# Patient Record
Sex: Female | Born: 1940 | Race: White | Hispanic: No | Smoking: Former smoker
Health system: Southern US, Community
[De-identification: ages and names within clinical notes are randomized; demographics above are authoritative.]

## PROBLEM LIST (undated history)

## (undated) DIAGNOSIS — K279 Peptic ulcer, site unspecified, unspecified as acute or chronic, without hemorrhage or perforation: Secondary | ICD-10-CM

## (undated) DIAGNOSIS — C7951 Secondary malignant neoplasm of bone: Secondary | ICD-10-CM

## (undated) DIAGNOSIS — D3A00 Benign carcinoid tumor of unspecified site: Secondary | ICD-10-CM

## (undated) DIAGNOSIS — I10 Essential (primary) hypertension: Secondary | ICD-10-CM

## (undated) DIAGNOSIS — IMO0002 Reserved for concepts with insufficient information to code with codable children: Secondary | ICD-10-CM

## (undated) HISTORY — DX: Secondary malignant neoplasm of bone: C79.51

## (undated) HISTORY — PX: CHOLECYSTECTOMY: SHX55

## (undated) HISTORY — PX: HERNIA REPAIR: SHX51

## (undated) HISTORY — PX: ANKLE DEBRIDEMENT: SHX1147

## (undated) HISTORY — DX: Benign carcinoid tumor of unspecified site: D3A.00

## (undated) HISTORY — DX: Reserved for concepts with insufficient information to code with codable children: IMO0002

## (undated) HISTORY — DX: Peptic ulcer, site unspecified, unspecified as acute or chronic, without hemorrhage or perforation: K27.9

## (undated) HISTORY — PX: DILATION AND CURETTAGE OF UTERUS: SHX78

## (undated) HISTORY — DX: Essential (primary) hypertension: I10

---

## 2001-09-29 ENCOUNTER — Observation Stay (HOSPITAL_COMMUNITY): Admission: RE | Admit: 2001-09-29 | Discharge: 2001-09-30 | Payer: Self-pay | Admitting: General Surgery

## 2003-10-18 ENCOUNTER — Emergency Department (HOSPITAL_COMMUNITY): Admission: EM | Admit: 2003-10-18 | Discharge: 2003-10-18 | Payer: Self-pay | Admitting: Emergency Medicine

## 2003-10-26 ENCOUNTER — Emergency Department (HOSPITAL_COMMUNITY): Admission: RE | Admit: 2003-10-26 | Discharge: 2003-10-26 | Payer: Self-pay | Admitting: Emergency Medicine

## 2003-10-29 ENCOUNTER — Inpatient Hospital Stay (HOSPITAL_COMMUNITY): Admission: AD | Admit: 2003-10-29 | Discharge: 2003-11-15 | Payer: Self-pay | Admitting: Family Medicine

## 2003-11-01 HISTORY — PX: ESOPHAGOGASTRODUODENOSCOPY: SHX1529

## 2003-11-04 ENCOUNTER — Encounter (INDEPENDENT_AMBULATORY_CARE_PROVIDER_SITE_OTHER): Payer: Self-pay | Admitting: *Deleted

## 2003-11-04 ENCOUNTER — Encounter: Payer: Self-pay | Admitting: Internal Medicine

## 2003-11-09 HISTORY — PX: OTHER SURGICAL HISTORY: SHX169

## 2003-11-09 HISTORY — PX: COLONOSCOPY: SHX174

## 2003-11-19 ENCOUNTER — Other Ambulatory Visit: Admission: RE | Admit: 2003-11-19 | Discharge: 2003-11-19 | Payer: Self-pay | Admitting: Obstetrics & Gynecology

## 2004-10-14 ENCOUNTER — Ambulatory Visit (HOSPITAL_COMMUNITY): Admission: RE | Admit: 2004-10-14 | Discharge: 2004-10-14 | Payer: Self-pay | Admitting: Obstetrics & Gynecology

## 2007-01-30 ENCOUNTER — Ambulatory Visit (HOSPITAL_COMMUNITY): Admission: RE | Admit: 2007-01-30 | Discharge: 2007-01-30 | Payer: Self-pay | Admitting: Family Medicine

## 2007-02-06 ENCOUNTER — Ambulatory Visit (HOSPITAL_COMMUNITY): Admission: RE | Admit: 2007-02-06 | Discharge: 2007-02-06 | Payer: Self-pay | Admitting: Family Medicine

## 2008-05-15 ENCOUNTER — Ambulatory Visit (HOSPITAL_COMMUNITY): Admission: RE | Admit: 2008-05-15 | Discharge: 2008-05-15 | Payer: Self-pay | Admitting: Family Medicine

## 2008-11-05 ENCOUNTER — Encounter: Payer: Self-pay | Admitting: Internal Medicine

## 2009-12-09 ENCOUNTER — Encounter: Payer: Self-pay | Admitting: Internal Medicine

## 2010-04-01 ENCOUNTER — Ambulatory Visit (HOSPITAL_COMMUNITY): Admission: RE | Admit: 2010-04-01 | Discharge: 2010-04-01 | Payer: Self-pay | Admitting: Family Medicine

## 2010-04-08 ENCOUNTER — Ambulatory Visit: Payer: Self-pay | Admitting: Cardiology

## 2010-04-09 ENCOUNTER — Ambulatory Visit (HOSPITAL_COMMUNITY): Admission: RE | Admit: 2010-04-09 | Discharge: 2010-04-09 | Payer: Self-pay | Admitting: Family Medicine

## 2010-04-09 ENCOUNTER — Encounter: Payer: Self-pay | Admitting: Family Medicine

## 2010-07-28 NOTE — Letter (Signed)
Summary: External Other  External Other   Imported By: Peggyann Shoals 12/09/2009 10:43:31  _____________________________________________________________________  External Attachment:    Type:   Image     Comment:   External Document

## 2010-11-13 NOTE — Op Note (Signed)
NAME:  Sarah Casey, Sarah Casey                      ACCOUNT NO.:  [id number]   MEDICAL RECORD NO.:  [id number]                   PATIENT TYPE:  INP   LOCATION:  A316                                 FACILITY:  [facility]   PHYSICIAN:  R. Roetta Sessions, M.D.              DATE OF BIRTH:  01/12/1941   DATE OF PROCEDURE:  10/30/2003  DATE OF DISCHARGE:                                 OPERATIVE REPORT   PROCEDURE:  Esophagogastroduodenoscopy.   INDICATIONS:  The patient is a 70 year old lady admitted to the hospital  with abdominal pain, nausea and vomiting.  An EGD is now being done to  further evaluate her symptoms.  This approach has been discussed with the  patient at length.  The potential risks, benefits and alternatives have been  reviewed.  Please see my documentation in the medical record.   DESCRIPTION OF PROCEDURE:  Oxygen saturation, blood pressure, pulse and  respiration were monitored throughout the entire procedure.  Conscious  sedation with IV Versed and Demerol in incremental doses.  The instrument  was the Olympus video chip gastroscope.   FINDINGS:  Esophagus:  Examination of the tubular esophagus reveals  __________  Schatzki's ring; otherwise the esophageal mucosal appeared  normal.  The EG junction was tight and easily traversed.   Stomach:  The gastric cavity was empty and insufflated well with air.  A  thorough examination of the gastric mucosa including the retroflexed view of  the proximal stomach and esophagogastric junction demonstrated normal  mucosa, patent pylorus.   Duodenum:  Examination of the bulb revealed diffuse bulbar edema and some  superficial erosions.  No ulcer crater was identified.  Small bowel was  patent through the second portion of the duodenum.  Otherwise, mucosa of D1 and D2 appeared normal.   THERAPY AND DIAGNOSTIC MANEUVERS:  None.   The patient tolerated the procedure well and was reactive to endoscopy.   IMPRESSION:  1. __________  Schatzki's ring; otherwise normal esophagus.  Schatzki's ring     not manipulated.  2. Normal gastric mucosa, patent pylorus bulbar edema, and erosions of     uncertain significance.  No gastric outlet obstruction.  Otherwise D1 and     D2 appear normal.   There is no explanation for the patient's acute GI symptoms based on today's  exam and findings.  It is amylase and lipase came back normal at 63 and 41.   RECOMMENDATIONS:  1. Proceed with abdominal pelvic CT scan to further evaluate her abdominal     pain and the fullness appreciated on physical examination above her     umbilicus.  2. Would reattempt H pylori treatment once over her acute illness.  3. Because she is heme-positive, would offer this lady an outpatient     colonoscopy to complete her evaluation.  4. Further recommendations to follow.      ___________________________________________  Jonathon Bellows, M.D.   RMR/MEDQ  D:  10/30/2003  T:  10/31/2003  Job:  [id number]   cc:   Donna Bernard, M.D.  [address]  [place]  [place] [address]  Fax: [phone]

## 2010-11-13 NOTE — Op Note (Signed)
NAME:  Sarah Casey, Sarah Casey                      ACCOUNT NO.:  [id number]   MEDICAL RECORD NO.:  [id number]                   PATIENT TYPE:  INP   LOCATION:  A316                                 FACILITY:  [facility]   PHYSICIAN:  Lionel December, M.D.                 DATE OF BIRTH:  04/13/1941   DATE OF PROCEDURE:  11/09/2003  DATE OF DISCHARGE:                                 OPERATIVE REPORT   PROCEDURE:  Total colonoscopy with polypectomy.   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  Her EGD did not  reveal any primary lesion.  She is undergoing colonoscopy followed by a  small bowel study if necessary.  Family history is positive for colon  carcinoma.  Procedure risks were reviewed with the patient.  Informed  consent was obtained.   PREOPERATIVE MEDICATIONS:  1. Demerol 50 mg IV.  2. Versed 7 mg IV in divided dose.   FINDINGS:  Procedure performed in endoscopy suite.  The patient's vital  signs and O2 saturations were monitored during the procedure and remained  stable.  The patient was placed in left lateral decubitus position and  rectal examination performed.  No abnormality noted on external or digital  exam.  Olympus video scope was placed in the rectum and advanced under  vision in sigmoid colon and beyond.  Preparation was satisfactory.  She had  a few scattered diverticula at sigmoid colon.  Scope was passed to cecum  which was identified by lipomatous ileocecal valve.  Appendiceal orifice was  well-seen.  Pictures taken for the record.  There was a 7 mm polyp across  the ileocecal valve which was snared and freed for histologic examination.  There were 3 polyps in the area of hepatic flexure that were ablated by cold  biopsy.  Two that were located distally were placed in 1 container and  labeled proximal transverse colon, another one labeled hepatic flexure.  There was another small  polyp of rectum that was ablated by a cold biopsy.  The scope was retroflexed to examine the anorectal junction which was  unremarkable.  The scope was straightened and withdrawn.  The patient  tolerated the procedure well.   FINAL DIAGNOSES:  1. Five small polyps.  One from cecum was snared.  The others were biopsied,     three in the area of hepatic flexure     and one at rectum.  None of these polyps appeared to be malignant in     appearance.  2. Sigmoid colon diverticulosis.   RECOMMENDATIONS:  We will proceed with small bowel capsule study today.      ___________________________________________  Lionel December, M.D.   NR/MEDQ  D:  11/09/2003  T:  11/10/2003  Job:  [id number]   cc:   Donna Bernard, M.D.  [address]  [place]  [place] [address]  Fax: [phone]   Ladona Horns. Neijstrom, MD  [address]  [place]  [place] [address]  Fax: [phone]

## 2010-11-13 NOTE — Consult Note (Signed)
NAME:  Sarah Casey, Sarah Casey                      ACCOUNT NO.:  [id number]   MEDICAL RECORD NO.:  [id number]                   PATIENT TYPE:  INP   LOCATION:  A316                                 FACILITY:  [facility]   PHYSICIAN:  R. Roetta Sessions, M.D.              DATE OF BIRTH:  10-17-1940   DATE OF CONSULTATION:  DATE OF DISCHARGE:                                   CONSULTATION   CHIEF COMPLAINT:  The patient is a 70 year old Caucasian female who has been  experiencing mid abdominal pain since October 18, 2003.  The patient has been  to the emergency room two times in last two weeks with pain which she  describes as a 10/10.  The pain was localized to the mid abdominal area  which some radiation to the left flank.  Emergency room treatment consistent  of pain medications.  The patient was seen by her primary care physician,  Dr. Gerda Diss, who tested the patient for H pylori and treated her when results  were positive with a Prevpac.  The patient states she was unable to tolerate  the Prevpac and reacted by vomiting and having extreme nausea.  The patient  was seen by Dr. Gerda Diss on Oct 29, 2003, and was guaiac positive by hemoccult.  The patient was admitted by Dr. Gerda Diss.  Prior to this episode, the patient  denies fever.  She did have occasional shaking chills. Denies weight changes  or loss of appetite.  The patient denies any heart problems such as chest  pain, palpitations or fluttering.  The patient denies respiratory problems  such as shortness of breath or coughing.  The patient states that prior to  this two-week period, she had some belching, but no frank gastroesophageal  reflux disease or reflux.  The patient had an EGD done years ago and was  diagnosed as having peptic ulcer disease.  The patient denies hematemesis.  The patient reports swallowing difficulty with food, but not more than one  to two times per month.  The patient denies diarrhea or constipation,  reporting one formed  stool a day as being normal for her.  The patient  denies hematochezia or melena.  The patient has never had a colonoscopy.   CURRENT MEDICATIONS:  At home, the patient states she takes an occasional  Tylenol.  She denies the use of aspirin, nonsteroidals, Goody powders,  Stanback or BC's.  The patient had been on a Prevpac and Oxycodone for her  pain.   Since being admitted to the hospital, the patient is on:  1. Protonix 40 mg q.24heart rate.  2. Xanax.  3. Reglan.  4. Morphine sulfate for pain as needed.   PAST MEDICAL HISTORY:  The patient reports no chronic illnesses.   SURGICAL HISTORY:  1. Umbilical hernia repair by Dr. Lovell Sheehan approximately two years ago.  2. Cholecystectomy by Dr. Lovell Sheehan in 1991.   ALLERGIES:  Levaquin.   FAMILY  HISTORY:  Mother died at the age of 11 of natural causes.  Father  died at the age of 53 of natural causes.  The patient has two brothers in  good health.  The patient has five sisters, two of whom are dead, one of  natural causes, and one who died of colon cancer at the age of 54.   SOCIAL HISTORY:  The patient is widowed and lives alone.  The patient has 10  children in good health.  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  The patient smokes one pack of  cigarettes per day for the last 35 years.  The patient drinks alcohol  occasionally, not more than one time per year.  The patient denies the use  of street drugs.   PHYSICAL EXAMINATION:  VITAL SIGNS:  Temperature 97.5, pulse 57,  respirations 20, blood pressure 103/47.  HEENT:  The head is normocephalic, atraumatic.  Eyes:  Clear sclerae without  icterus.  Conjunctivae are pink. Oropharynx is moist and pink without  lesions.  The patient has upper and lower dentures.  No masses were  palpated.  NECK:  Supple.  SKIN:  Dry, clear, and without jaundice.  CARDIOVASCULAR:  Heart is regular rate and rhythm, no murmurs, rubs or  gallops.   RESPIRATORY:  The patient has a few faint crackles at the right lung base.  No wheezes or rhonchi.  ABDOMEN:  On exam, the abdomen is soft.  There is a periumbilical scar from  hernia repair.  There is no organomegaly.  There is a fullness palpated,  superior umbilicus which is tender to palpation.  Bowel sounds are active  and present in all four quadrants.  EXTREMITIES:  No clubbing, no edema or cyanosis.   LABORATORY DATA:  White count 5.6, hemoglobin 13.0, hematocrit 37.6,  platelets 356,000.  Sodium 136, potassium 4.6, chloride 106, cO2 29, glucose  is 97.  BUN 20, creatinine 0.9.  Total bilirubin 0.5, alkaline phosphatase  85, SGOT 22, SGPT 20.  Total protein 6.2, albumin 3.9, calcium 9.3.   DIAGNOSTIC IMAGING AND ABDOMINAL X-RAY:  On admission, showed normal gas  pattern.   IMPRESSION:  1. With the patient's history of peptic ulcer disease, there could be a     recurrence especially in light of the H pylori serology being positive.     Pancreatitis is a possibility, and laboratory studies including amylase     and lipase will be done.  2. It is possible that the patient has an incarcerated hernia with her     history and with the fullness palpated superior umbilicus.   RECOMMENDATIONS:  1. Amylase and lipase, to investigate pancreatitis and EGD today based on     the patient's H pylori positive serology and history of peptic ulcer     disease.  2. Based on these results, a CT scan of the abdomen would be done after the     EGD, and recommendations for colonoscopy secondary to the patient's     family history and the fact that she has never had a colonoscopy.   Thank you for this consultation.     ________________________________________  ___________________________________________  Ashok Pall, PA                           Jonathon Bellows, M.D.   GC/MEDQ  D:  10/30/2003  T:  10/30/2003  Job:  [id number]

## 2010-11-13 NOTE — Consult Note (Signed)
NAME:  Sarah Casey, Sarah Casey                      ACCOUNT NO.:  [id number]   MEDICAL RECORD NO.:  [id number]                   PATIENT TYPE:  INP   LOCATION:  A316                                 FACILITY:  [facility]   PHYSICIAN:  Ladona Horns. Neijstrom, MD               DATE OF BIRTH:  1941/06/24   DATE OF CONSULTATION:  11/06/2003  DATE OF DISCHARGE:                                   CONSULTATION   DIAGNOSES:  1. 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  2. Long standing smoking history.  3. Anemia.  4. Uterine abnormality which needs to be evaluated with possible biopsy, but     clearly she needs a GYN evaluation.   HISTORY:  This is a very pleasant, 70 year old Caucasian lady who was  admitted on 10/29/2003 with a 2-week history of ongoing abdominal pain that  is primarily epigastrically located, usually worse after meals, associated  with nausea and vomiting, but no distinct real weight loss.  She was given  medications for H. pylori positivity which she could not really keep down.  The nausea and vomiting continued. She sought help with Dr. Milinda Cave in Dr.  Fletcher Anon absence and then even went to the emergency room and when things  were not getting any better, Dr. Gerda Diss felt that he should admit her for  definitive diagnosis and therapy.   She was found on scans of her liver to have a large lesion, at least 6 cm in  size, very atypical and suggestive of metastatic disease in that area.  A  biopsy was done, this past Monday, which shows a neuroendocrine carcinoma,  but special stains are pending by Dr. Berneta Levins at [facility].   Upon this admission her white count was 5200.  Hemoglobin 11.4 gm.  MCV was  normal, platelets of 297,000.  Differential was unremarkable.  Her PT was  normal. Her bleeding time was 6 minutes.  Her electrolytes were normal.  Her  BUN and creatinine  were normal.  Her liver enzymes showed a normal alkaline  phosphatase, normal SGOT, normal SGPT, normal total protein, slightly low  albumin at 3.4, normal calcium. Amylase and lipase were normal.   She has been seen by me today and I have discussed things with her.   She is a widow.  She lives by herself here in the [place] area.  She has  been a smoker off and on since around age 60 when her husband died.  She has  smoked as much as a pack of cigarettes a day, possibly more.   She has 9 boys and 1 daughter and they are in good health to the best of her  knowledge.  She has several siblings, but some of them have died from either  chronic lung disease or other ailments.  She has seen some blood in her stools and she has been seen by Drs. Rourk or  Rehman in consultation, I should add.   PHYSICAL EXAMINATION:  GENERAL:  Her physical exam shows a pleasant lady.  VITAL SIGNS:  Show no fever, normal blood pressure at this time.  LYMPH NODE EXAM:  Negative throughout.  LUNGS:  Show mildly decreased breath sounds.  No rubs or rales.  BREASTS:  Exam is negative for masses.  HEART:  Her heart shows a regular rhythm and rate without distinct murmur,  rub, or gallop.  ABDOMEN:  She is tender, especially over the epigastric and left upper  quadrants, but I cannot feel an enlarged liver. There is no distinct rub  over the liver.  Bowel sounds are diminished but present.  She has no  inguinal nodes, axillary nodes, etcetera.  EXTREMITIES:  She has no peripheral edema.  Pulses are trace to 1+ in her  feet.  HEENT:  She essentially has no oral abnormalities. She has many teeth, of  course, that are gone.  She has pupils which are equally round and reacted  to light.  PELVIC:  She states that she has not had a Pap smear in at least 28 years.  She denied any vaginal bleeding, I should add.  She does not have any pain  in her pelvis.  RECTAL:  Her bowel function has been good, but she has  noticed some blood in  her stools.   I have reviewed this lady's x-rays and with this neuroendocrine tumor she  certainly needs a formal CT of the chest with her history of smoking.   We need to await the final results from Dr. Clelia Croft.  We will be in touch with  her if the results are not back, but we will get the CT of the chest ordered  today.      ___________________________________________                                            Ladona Horns. Mariel Sleet, MD   ESN/MEDQ  D:  11/06/2003  T:  11/06/2003  Job:  [id number]   cc:   Donna Bernard, M.D.  [address]  [place]  [place] [address]  Fax: [phone]

## 2010-11-13 NOTE — H&P (Signed)
NAME:  Sarah Casey, Sarah Casey                      ACCOUNT NO.:  [id number]   MEDICAL RECORD NO.:  [id number]                   PATIENT TYPE:  INP   LOCATION:  A316                                 FACILITY:  [facility]   PHYSICIAN:  Donna Bernard, M.D.             DATE OF BIRTH:  18-Sep-1940   DATE OF ADMISSION:  10/29/2003  DATE OF DISCHARGE:                                HISTORY & PHYSICAL   CHIEF COMPLAINT:  Abdominal pain.   HISTORY OF PRESENT ILLNESS:  This patient is a 70 year old white female with  a reported history of prior peptic ulcer disease, who presented to the  office on the day of admission with complaints of abdominal pain.  The  patient had been seen twice in our office over the past two weeks, once in  Dr. Maryjean Morn. McGowen's office, and twice in the emergency room with  abdominal complaints.  She noted mid-epigastric pain and tenderness.  She  had intermittent spells of vomiting.  Blood work revealed positive H pylori.  She was started on Prevpac in the last few days, but states unable to take  it due to nausea and vomiting.  When seen on the day of admission, the  patient notes epigastric discomfort intermittent, and virtual inability to  drink any type of fluids.  She is unable to take her medication.   PRIOR MEDICAL HISTORY:  Significant for diagnosis of stomach ulcers in the  1980's, also significant for recent umbilical hernia repair in 2003.   PRIOR SURGERIES:  1. Remote ankle surgery.  2. Remote cholecystectomy.   FAMILY HISTORY:  Noncontributory.   ALLERGIES:  The patient states LODINE.   SOCIAL HISTORY:  The patient is widowed and smokes one pack a day.  She has  10 children.   REVIEW OF SYSTEMS:  Otherwise negative.   PHYSICAL EXAMINATION:  VITAL SIGNS:  Blood pressure 140/86.  GENERAL:  The patient is alert and in some distress, holding her abdomen.  HEENT:  Normal.  Mucous membranes are slightly dry.  NECK:  Supple.  LUNGS:  Clear.  HEART:   Regular rate and rhythm.  ABDOMEN:  Mild-to-moderate epigastric tenderness to deep palpation.  No CVA  tenderness.  Good bowel sounds, no masses.  EXTREMITIES:  Normal.  RECTAL EXAM:  Heme positive stool noted.   IMPRESSION:  1. Mid abdominal pain and tenderness with vomiting, unable to keep down food     or liquids.  2. Recurrence of peptic ulcer disease with a gastric outlet syndrome     picture.  This is discussed with the patient.  There are other possible     etiologies to her distress.   PLAN:  1. Admit for IV fluids.  2. IV Protonix.  3. GI consultation.  4. Other orders as now in the chart.     ___________________________________________  Donna Bernard, M.D.   Karie Chimera  D:  10/31/2003  T:  10/31/2003  Job:  [id number]

## 2010-11-13 NOTE — Op Note (Signed)
NAME:  Sarah Casey, Sarah Casey                      ACCOUNT NO.:  [id number]   MEDICAL RECORD NO.:  [id number]                   PATIENT TYPE:  INP   LOCATION:  A316                                 FACILITY:  [facility]   PHYSICIAN:  R. Roetta Sessions, M.D.              DATE OF BIRTH:  March 02, 1941   DATE OF PROCEDURE:  11/01/2003  DATE OF DISCHARGE:                                 OPERATIVE REPORT   PROCEDURE:  Esophagogastroduodenoscopy with a side viewing duodenoscope.   ENDOSCOPIST:  Gerrit Friends. Rourk, M.D.   INDICATIONS FOR PROCEDURE:  The patient is a 71 year old lady admitted to  the hospital with upper abdominal pain, nausea and vomiting.  A CT scan  suggests a dilated bile duct and a right hepatic lobe lesion.  Also  thickening of the uterus demanding further evaluation as well.  She has  positive family history of colorectal carcinoma.  She is heme-positive and  has never had a colonoscopy.  An MRCP followed the abdominal CT.  The  biliary tree  did not appear to be as dilated; there was a nice taper  distally.  There was a question of an ampullary mass seen on CT scan.  The  MRI confirmed a right hepatic lobe lesion.  There was no obvious biliary  stricture or stone.  Her LFTs have been repeatedly normal.   She underwent an EGD a couple of days ago to evaluate her symptoms.  She had  some bulbar erosions and edema; but, otherwise, no significant findings. I  did no adequate see the ampullary area.  Repeat upper endoscopy with a side  viewing duodenoscope to image the ampulla is now being done just to make  sure that she does not have a distal tumor.  This approach has been  discussed with the patient and family members at length.  If there was a  tumor present the algorithm was to go ahead and cannulate the bile duct,  perform a cholangiogram and potentially put a prophylactic stent in.  All  parties are agreeable.  Please see the documentation in the medical record.   PROCEDURE NOTE:   The patient was placed on the fluoroscopic table in the  semiprone position.  Conscious sedation: IV Versed and Demerol in  incremental doses.  Cetacaine spray for topical oropharyngeal anesthesia.   INSTRUMENT:  Olympus therapeutic side viewing duodenoscope.   FINDINGS:  Cursory examination of distal esophagus and stomach revealed no  abnormalities.  The pylorus was patent.  Again, some bulbar edema and  erosions were identified without a frank ulcer being seen.  The second  portion of the duodenum was easily accessed with the scope.  It was pulled  back to the short position 55 cm from the incisors. The Ampulla of Vater was  readily identified on the medial wall of the second portion of the duodenum.  There was noted to be a somewhat prominent intramural segment; however, the  mucosa at the ampullary orifice and surrounding it appeared otherwise  normal.  Please see photos.  No attempts at ERCP were made.   The patient tolerated the brief procedure well.   IMPRESSION:  1. Duodenal bulbar erosion/edema as described above.  2. Distal esophagus, distal stomach, and pylorus appeared normal.  3. Normal Ampulla of Vater as described above.   DISCUSSION:  I have had additional discussions with Dr. Donna Bernard,  Dr. Tyron Russell and Dr. Jean Rosenthal about the case.  Drs. Jean Rosenthal and Tyron Russell went back  and looked at the CT scan feel that the bile duct diameter previously given  of 15 mm was somewhat spuriously high.  It was felt that the cystic duct and  bile duct were taken together in measuring the bile duct diameter and the  bile duct was more like 8-to-9 mm which more fits with the clinical scenario  otherwise.  Furthermore, it is felt now by Drs. Jean Rosenthal and Tyron Russell that the  lesion in the right lobe of the liver most likely represents a neoplastic  process either a metastatic foci or primary hepatocellular carcinoma.   RECOMMENDATIONS:  1. Proceed with a fine needle aspirate of the liver in  Tab on     11/04/2003.  We will check coags today.  I spoke to Dr. Tyron Russell this is in     process.  Family members are aware of this approach.  2. Further recommendations to follow.  3. Dr. Leone Payor will be seeing Sarah Casey over the weekend as needed.  4. We will go ahead and advance her diet in the interim as tolerated.      ___________________________________________                                            Jonathon Bellows, M.D.   RMR/MEDQ  D:  11/01/2003  T:  11/01/2003  Job:  [id number]   cc:   Donna Bernard, M.D.  [address]  [place]  [place] [address]  Fax: [phone]   R. Roetta Sessions, M.D.  [address]  [place]  [place] [address]  Fax: [phone]

## 2010-11-13 NOTE — Op Note (Signed)
Sarah Casey, Sarah Casey                        ACCOUNT NO.:  [id number]   MEDICAL RECORD NO.:  [id number]                  PATIENT TYPE:   LOCATION:                                       FACILITY:  [facility]   PHYSICIAN:  Lionel December, M.D.                 DATE OF BIRTH:  12-20-1940   DATE OF PROCEDURE:  11/09/2003  DATE OF DISCHARGE:                                 OPERATIVE REPORT   PROCEDURE:  Given capsule small bowel study report.   ATTENDING:  Lionel December, M.D.   INDICATION:  Sarah Casey is a 70 year old Caucasian female who has  metastatic carcinoid liver.  Primary is unknown.  No lesions were found on  EGD and/or colonoscopy.  She is therefore undergoing a Given capsule study.  The procedure and risks were reviewed with the patient and informed consent  was obtained.   PROCEDURE INFORMATION AND FINDINGS:  Patient swallowed the Given capsule  without any difficulty.  Transit time to duodenum was about an hour.   Transit time through small bowel was approximately 3 hours.   There was an area of mid-to-distal small bowel full of fresh blood.  In this  segment, there was a horseshoe-shaped mass partially filling the lumen.  Just below this abnormality was a submucosal mass.  Both of these lesions  were well-identified and photographed.  Mucosa of the rest of the small  bowel was normal and ileocecal valve was normal.  There was a scant amount  of blood coating the cecal/ascending colon mucosa which was felt to be the  biopsy site from a polypectomy performed earlier.   IMPRESSION:  Small bowel polypoidal lesion with active bleeding, very  suspicious for primary carcinoid.  There was a second submucosal mass,  probably another metastatic lesion.   RECOMMENDATIONS:  The patient is scheduled for OctreoScan next week.  If  there is no evidence of wide-spread metastatic disease, she may be a  candidate for segmental resection of her small bowel as well as resection of  this  hepatic lesion.  Further recommendations will be made by Dr. Ladona Horns.  Neijstrom.      ___________________________________________                                            Lionel December, M.D.   NR/MEDQ  D:  11/10/2003  T:  11/11/2003  Job:  [id number]   cc:   Ladona Horns. Neijstrom, MD  [address]  [place]  [place] [address]  Fax: [phone]   W. Simone Curia, M.D.  [address]  [place]  [place] [address]  Fax: [phone]

## 2010-11-13 NOTE — Discharge Summary (Signed)
NAME:  Sarah Casey, Sarah Casey                      ACCOUNT NO.:  [id number]   MEDICAL RECORD NO.:  [id number]                   PATIENT TYPE:  INP   LOCATION:  A316                                 FACILITY:  [facility]   PHYSICIAN:  Donna Bernard, M.D.             DATE OF BIRTH:  [birth year]   DATE OF ADMISSION:  10/29/2003  DATE OF DISCHARGE:  11/15/2003                                 DISCHARGE SUMMARY   FINAL DIAGNOSES:  1. Metastatic carcinoid tumor of the liver.  2. Small bowel primary carcinoma.  3. Thickened endometrium, workup pending.  4. History of peptic ulcer disease.   FINAL DISPOSITION:  1. The patient is discharged to home.  2. The patient is to follow-up with Dr. Johna Sheriff next week.  3. The patient is to follow-up with Dr. __________ next week.  4. The patient is to follow-up with Dr. Lubertha South next week.  5. Boost one can t.i.d.  6. Phenergan 25 mg q.4 p.r.n. for nausea.  7. Oxycodone 5 mg one every four to six hours as needed for pain.   INITIAL HISTORY AND PHYSICAL:  Please see H&P as dictated.   HOSPITAL COURSE:  This patient is a 70 year old white female with a prior  history of peptic ulcer disease who presented in the office on the day of  admission with complaints of abdominal pain.  She also is having significant  nausea and vomiting and epigastric discomfort.  She had been seen for  midepigastric pain one week prior in Dr. Samul Dada office and he  appropriately ordered an H. pylori, which was positive.  Proper therapy was  initiated.  However, the following week, the patient's symptoms continued to  worsen.  On the day of admission, she was unable to keep anything down and  was complaining of severe pain in the epigastrium.  The patient was admitted  to the hospital.  The GI folks were consulted.  She was given IV fluids, IV  Protonix, IV pain control and nausea control.  An upper GI was performed.  This showed no significant findings.  Therefore further  tests were pursued.  An ultrasound showed a question of a liver mass along with questionable  enlargement of the common bile duct.  Based on this a scan was performed  which showed what appeared to be a liver tumor.  The radiology folks  recommended an MRI.  We pursued this.  This confirmed the presence of tumor.  The GI folks at that point felt that a percutaneous biopsy would be  warranted.  This was scheduled.  Unfortunately we had to wait for several  days for this.  Meanwhile the patient continued to have significant pain and  nausea and continued inpatient management was warranted.  The biopsy  returned positive for carcinoid tumor.  Based on this Dr. Mariel Sleet was  consulted.  He recommended further search for a primary.  Colonoscopy was  performed and this  was essentially negative.  A small bowel endoscopy was  scheduled.  This revealed the presence of a probable primary lesion in the  small bowel.  Following this an octreotide scan was ordered via Dr.  Mariel Sleet.  Once again, this took several days to complete.  During this  time, the patient thankfully had improvement in her symptoms with  significantly less nausea and less pain.  The scan came back showing only  increased activity in the area of the known primary in the small bowel and  of the metastasis of the endometrium and the liver.  Based on this, Dr.  Mariel Sleet and myself felt that this could be potentially amenable to  surgical therapy.  Consultation was made with the surgeon.  The patient was  feeling better.  Of note, during her workup we found enlarged thickened  endometrium and GYN folks were consulted.  They recommended ongoing workup  in an outpatient basis.  The day of discharge, the patient was feeling  better and she was discharged home.  Diagnoses and disposition as noted  above.     ___________________________________________                                         Donna Bernard, M.D.   WSL/MEDQ  D:   11/28/2003  T:  11/29/2003  Job:  [id number]

## 2010-11-13 NOTE — Op Note (Signed)
[facility]  Patient:    Sarah Casey, Sarah Casey Visit Number: [id number] MRN: [id number]          Service Type: OBV Location: 3A A323 01 Attending Physician:  Dalia Heading Dictated by:   Franky Macho, M.D. Proc. Date: 09/29/01 Admit Date:  09/29/2001   CC:         Donna Bernard, M.D.   Operative Report  AGE:  70 years old.  PREOPERATIVE DIAGNOSIS:  Umbilical hernia, incarcerated.  POSTOPERATIVE DIAGNOSIS:  Umbilical hernia, incarcerated.  PROCEDURE:  Umbilical herniorrhaphy.  SURGEON:  Franky Macho, M.D.  ANESTHESIA:  General.  INDICATION:  Patient is a 70 year old white female who presents with an incarcerated umbilical hernia.  The risks and benefits of the procedure including bleeding, infection, and recurrence of the hernia were fully explained to the patient, who gave informed consent.  DESCRIPTION OF PROCEDURE:  Patient was placed in the supine position.  After general anesthesia was administered, the abdomen was prepped and draped using the usual sterile technique with Betadine.  An infraumbilical incision was made down to the fascia.  The patient was noted to have a large incarcerated hernia with omentum present.  In order to reduce the omentum that was in the hernia sac, the hernia sac was entered into and an LDS stapler was used to come across the omentum.  The omentum was removed from the operative field.  The remaining omentum was reduced.  The hernia sac was excised.  The fascial edges were found and reapproximated using 0 Surgidac interrupted sutures; this was performed in a two-layer fashion.  The base of the umbilicus was then secured back to the fascia using a 2-0 Vicryl interrupted suture.  The subcutaneous layer was reapproximated using a 3-0 Vicryl interrupted suture.  The skin was closed using staples.  Betadine ointment and dry sterile dressing were applied.  Sensorcaine 0.5% was instilled into the  surrounding wound.  All tape and needle counts were correct at the end of the procedure.  The patient was awakened and transferred to PACU in stable condition.  COMPLICATIONS:  None.  SPECIMEN:  None.  BLOOD LOSS:  Minimal. Dictated by:   Franky Macho, M.D. Attending Physician:  Dalia Heading DD:  09/29/01 TD:  09/30/01 Job: [id number]

## 2010-11-13 NOTE — Op Note (Signed)
Sarah Casey, Sarah Casey            ACCOUNT NO.:  [id number]   MEDICAL RECORD NO.:  [id number]          PATIENT TYPE:  AMB   LOCATION:  DAY                           FACILITY:  [facility]   PHYSICIAN:  Lazaro Arms, M.D.   DATE OF BIRTH:  April 29, 1941   DATE OF PROCEDURE:  10/14/2004  DATE OF DISCHARGE:                                 OPERATIVE REPORT   PREOPERATIVE DIAGNOSES:  1.  Complex thickened endometrial stripe.  2.  Endometrial hyperplasia by biopsy last summer.   POSTOPERATIVE DIAGNOSES:  1.  Complex thickened endometrial stripe.  2.  Endometrial hyperplasia by biopsy last summer.  3.  Endometrial polyps.   PROCEDURE:  Hysteroscopy, dilatation and curettage and removal of polyps.   SURGEON:  Lazaro Arms, M.D.   ANESTHESIA:  General endotracheal due to reflux.   DESCRIPTION OF OPERATION:  The patient was taken to the operating room and  placed in supine position, where she underwent general endotracheal  anesthesia.  She was placed in the dorsal lithotomy position and prepped and  draped in the usual sterile fashion.  Her cervix was grasped, paracervical  block was placed.  The cervix was dilated to allow passage of the  hysteroscope.  Normal saline was used as a distending medium.  Multiple  polyps, probably five polyps, were identified.  The rest of the endometrium  looked fine.  A vigorous curettage was performed.  All polyps were removed  and sent to pathology for evaluation.  She does have a history of simple  hyperplasia previously from a biopsy I did last summer, but then she had to  have surgery for carcinoid of the small bowel.  She tolerated the procedure  well.  She had minimal blood loss.  She was awakened from anesthesia, taken  to the recovery room in good and stable condition.  All counts were correct,  and all specimens were sent to the lab.      LHE/MEDQ  D:  10/14/2004  T:  10/14/2004  Job:  [id number]

## 2010-12-04 ENCOUNTER — Emergency Department (HOSPITAL_COMMUNITY)
Admission: EM | Admit: 2010-12-04 | Discharge: 2010-12-04 | Disposition: A | Discharge status: Home / Self Care | Payer: PRIVATE HEALTH INSURANCE | Attending: Emergency Medicine | Admitting: Emergency Medicine

## 2010-12-04 ENCOUNTER — Emergency Department (HOSPITAL_COMMUNITY): Payer: PRIVATE HEALTH INSURANCE

## 2010-12-04 DIAGNOSIS — K219 Gastro-esophageal reflux disease without esophagitis: Secondary | ICD-10-CM | POA: Insufficient documentation

## 2010-12-04 DIAGNOSIS — T18108A Unspecified foreign body in esophagus causing other injury, initial encounter: Secondary | ICD-10-CM | POA: Insufficient documentation

## 2010-12-04 DIAGNOSIS — IMO0002 Reserved for concepts with insufficient information to code with codable children: Secondary | ICD-10-CM | POA: Insufficient documentation

## 2010-12-04 DIAGNOSIS — I10 Essential (primary) hypertension: Secondary | ICD-10-CM | POA: Insufficient documentation

## 2010-12-04 DIAGNOSIS — Y92009 Unspecified place in unspecified non-institutional (private) residence as the place of occurrence of the external cause: Secondary | ICD-10-CM | POA: Insufficient documentation

## 2011-08-23 ENCOUNTER — Encounter (HOSPITAL_COMMUNITY): Payer: Self-pay | Admitting: Oncology

## 2011-08-23 ENCOUNTER — Encounter (HOSPITAL_COMMUNITY): Payer: Medicare HMO | Attending: Oncology | Admitting: Oncology

## 2011-08-23 VITALS — BP 152/92 | HR 76 | Temp 98.0°F | Ht 63.25 in | Wt 185.0 lb

## 2011-08-23 DIAGNOSIS — Z859 Personal history of malignant neoplasm, unspecified: Secondary | ICD-10-CM

## 2011-08-23 DIAGNOSIS — D3A00 Benign carcinoid tumor of unspecified site: Secondary | ICD-10-CM

## 2011-08-23 DIAGNOSIS — I10 Essential (primary) hypertension: Secondary | ICD-10-CM | POA: Insufficient documentation

## 2011-08-23 DIAGNOSIS — Z8505 Personal history of malignant neoplasm of liver: Secondary | ICD-10-CM | POA: Insufficient documentation

## 2011-08-23 DIAGNOSIS — Z09 Encounter for follow-up examination after completed treatment for conditions other than malignant neoplasm: Secondary | ICD-10-CM | POA: Insufficient documentation

## 2011-08-23 DIAGNOSIS — R197 Diarrhea, unspecified: Secondary | ICD-10-CM | POA: Insufficient documentation

## 2011-08-23 DIAGNOSIS — E669 Obesity, unspecified: Secondary | ICD-10-CM | POA: Insufficient documentation

## 2011-08-23 DIAGNOSIS — R252 Cramp and spasm: Secondary | ICD-10-CM | POA: Insufficient documentation

## 2011-08-23 LAB — COMPREHENSIVE METABOLIC PANEL
ALT: 13 U/L (ref 0–35)
BUN: 27 mg/dL — ABNORMAL HIGH (ref 6–23)
CO2: 27 mEq/L (ref 19–32)
Calcium: 9.9 mg/dL (ref 8.4–10.5)
Creatinine, Ser: 0.69 mg/dL (ref 0.50–1.10)
GFR calc Af Amer: >90 mL/min (ref >90)
GFR calc non Af Amer: 86 mL/min — ABNORMAL LOW (ref >90)
Glucose, Bld: 97 mg/dL (ref 70–99)

## 2011-08-23 LAB — CBC
Hemoglobin: 12.7 g/dL (ref 12.0–15.0)
MCH: 27.3 pg (ref 26.0–34.0)
MCV: 85.4 fL (ref 78.0–100.0)
RBC: 4.65 MIL/uL (ref 3.87–5.11)

## 2011-08-23 NOTE — Patient Instructions (Signed)
Sarah Casey  [id number] 07-08-1940   [facility]  Discharge Instructions  RECOMMENDATIONS MADE BY THE CONSULTANT AND ANY TEST RESULTS WILL BE SENT TO YOUR REFERRING DOCTOR.   EXAM FINDINGS BY MD TODAY AND SIGNS AND SYMPTOMS TO REPORT TO CLINIC OR PRIMARY MD: we need to do some blood work, check urine for 5 HIAA and a CT scan of your abdomen.  MEDICATIONS PRESCRIBED: none   INSTRUCTIONS GIVEN AND DISCUSSED: Report flushing, increased diarrhea, etc.  SPECIAL INSTRUCTIONS/FOLLOW-UP: Lab work Needed today, Xray Studies Needed :  CT of your abdomen and Return to Clinic in 3 weeks.   I acknowledge that I have been informed and understand all the instructions given to me and received a copy. I do not have any more questions at this time, but understand that I may call the Specialty Clinic at [facility] at [phone] during business hours should I have any further questions or need assistance in obtaining follow-up care.    __________________________________________  _____________  __________ Signature of Patient or Authorized Representative            Date                   Time    __________________________________________ Nurse's Signature

## 2011-08-23 NOTE — Progress Notes (Signed)
This office note has been dictated.

## 2011-08-23 NOTE — Progress Notes (Signed)
Sarah Casey presented for [employer]. Labs per MD order drawn via Peripheral Line 23 gauge needle inserted in right AC  Good blood return present. Procedure without incident.  Needle removed intact. Patient tolerated procedure well.

## 2011-08-23 NOTE — Progress Notes (Signed)
CC:   Sarah Casey, M.D. Rise Mu, MD  DIAGNOSES: 1. History of metastatic carcinoid to liver status post surgical     resection by Dr. Marilynn Rail in 2005 and she states that she was there     2 years ago with no evidence of recurrent disease at that time. 2. History of hypertension. 3. Mild obesity. 4. Leg cramps. 5. History of smoking though she quit years ago.  This is a pleasant lady who is now 71 years old but who we saw here in 2005.  At that time she presented with a soft tissue mass in the right lobe of the liver 6.6 cm x 5.2 cm x 6.0 cm.  She underwent a biopsy eventually which showed it to be a carcinoid tumor.  She was seen by a physician she states in [place] though we do not have those records and then eventually transferred to Dr. Marilynn Rail for consultation who did definitive surgery.  She does have a history of gallbladder removed.  She has history of a D and C for endometrial thickening.  She has a history of a fractured right ankle with surgical repair.  She is here today with 1 of her daughters, her youngest child who was born 3 months before Raquelle's husband died.  MEDICATIONS:  Her medications are listed in the chart.  She states she has not lost weight but she started taking Nexium about 4 years ago. She states she is no longer on it because she thinks that it has been the cause of her watery diarrhea over the last 6-12 months. Interestingly she has not lost any weight in the last 6-12 months and she is now on Zantac 300 mg b.i.d.  She is also on Norvasc for her blood pressure, and she did take some Tylenol p.r.n. for back discomfort.  She occasionally uses ibuprofen and she does try to use loperamide for the diarrhea.  She is also on some Antivert for some dizziness.  She has also had leg cramps in the last several months, and she appropriately blamed it on her diarrhea she states and was taking some potassium pills at home which took away  the cramps but she stopped them and they are starting to come back she states.  She is not having nausea or vomiting.  No fevers, chills or night sweats.  She has not had mammography in several years she states.  She is living at home with 1 of her sons.  PHYSICAL EXAMINATION:  Her vital signs show weight of 185 pounds on a 5 foot 3 inch frame, BMI is 32.6, blood pressure 152/92 left arm sitting position, pulse 76 and regular, respirations 16 and unlabored.  She is afebrile.  She has no lymphadenopathy.  She has no obvious breast masses.  Her lungs are clear but with diminished breath sounds.  Her heart shows a regular rhythm and rate without murmur, rub or gallop. Her abdomen is soft without hepatosplenomegaly.  Bowel sounds are normal.  They are not increased by any means.  She has no peripheral edema of her legs or arms.  Pulses 1 to 2+ and symmetrical.  She is right-handed.  She does look slightly pale.  Pupils equally round, reactive to light.  Throat is clear.  Martin has not had true flushing but she has had this watery diarrhea for 6-12 months which she has planned on Nexium and since stopping it about 5 weeks ago she thinks it is better.  She has not  had a CT scan.  She has not had a serotonin level or 5HIAA of her urine so we will get those done, get mammography as well, get a CT scan with and without contrast and follow her and we will see her back in a few weeks.  Right now she looks very good, and we have to hope that this disease has remained in remission.  We will see her as soon as we get all these tests back.    ______________________________ Ladona Horns. Mariel Sleet, MD ESN/MEDQ  D:  08/23/2011  T:  08/23/2011  Job:  [id number]

## 2011-08-27 LAB — SEROTONIN SERUM: Serotonin, Serum: 1605 ng/mL — ABNORMAL HIGH (ref 56–244)

## 2011-09-03 ENCOUNTER — Ambulatory Visit (HOSPITAL_COMMUNITY)
Admission: RE | Admit: 2011-09-03 | Discharge: 2011-09-03 | Disposition: A | Discharge status: Home / Self Care | Payer: Medicare HMO | Source: Ambulatory Visit | Attending: Oncology | Admitting: Oncology

## 2011-09-03 DIAGNOSIS — D3A00 Benign carcinoid tumor of unspecified site: Secondary | ICD-10-CM

## 2011-09-03 DIAGNOSIS — R599 Enlarged lymph nodes, unspecified: Secondary | ICD-10-CM | POA: Insufficient documentation

## 2011-09-03 DIAGNOSIS — C787 Secondary malignant neoplasm of liver and intrahepatic bile duct: Secondary | ICD-10-CM | POA: Insufficient documentation

## 2011-09-03 DIAGNOSIS — R911 Solitary pulmonary nodule: Secondary | ICD-10-CM | POA: Insufficient documentation

## 2011-09-03 MED ORDER — IOHEXOL 300 MG/ML  SOLN
100.0000 mL | Freq: Once | INTRAMUSCULAR | Status: AC | PRN
  Administered 2011-09-03: 100 mL via INTRAVENOUS

## 2011-09-03 NOTE — Progress Notes (Signed)
Addended by: Sterling Big on: 09/03/2011 09:09 AM   Modules accepted: Orders

## 2011-09-07 ENCOUNTER — Other Ambulatory Visit (HOSPITAL_COMMUNITY): Payer: Self-pay | Admitting: Oncology

## 2011-09-07 DIAGNOSIS — R928 Other abnormal and inconclusive findings on diagnostic imaging of breast: Secondary | ICD-10-CM

## 2011-09-08 LAB — 5 HIAA, QUANTITATIVE, URINE, 24 HOUR: Volume, Urine-5HIAA: 900 mL/24 h

## 2011-09-17 ENCOUNTER — Encounter (HOSPITAL_COMMUNITY): Payer: Self-pay | Admitting: Oncology

## 2011-09-17 ENCOUNTER — Encounter (HOSPITAL_COMMUNITY): Payer: Medicare HMO | Attending: Oncology | Admitting: Oncology

## 2011-09-17 VITALS — BP 119/76 | HR 80 | Temp 98.3°F | Wt 187.7 lb

## 2011-09-17 DIAGNOSIS — E669 Obesity, unspecified: Secondary | ICD-10-CM | POA: Insufficient documentation

## 2011-09-17 DIAGNOSIS — C7B8 Other secondary neuroendocrine tumors: Secondary | ICD-10-CM

## 2011-09-17 DIAGNOSIS — Z09 Encounter for follow-up examination after completed treatment for conditions other than malignant neoplasm: Secondary | ICD-10-CM | POA: Insufficient documentation

## 2011-09-17 DIAGNOSIS — C787 Secondary malignant neoplasm of liver and intrahepatic bile duct: Secondary | ICD-10-CM

## 2011-09-17 DIAGNOSIS — C7A098 Malignant carcinoid tumors of other sites: Secondary | ICD-10-CM

## 2011-09-17 DIAGNOSIS — D3A00 Benign carcinoid tumor of unspecified site: Secondary | ICD-10-CM

## 2011-09-17 DIAGNOSIS — I10 Essential (primary) hypertension: Secondary | ICD-10-CM | POA: Insufficient documentation

## 2011-09-17 DIAGNOSIS — R252 Cramp and spasm: Secondary | ICD-10-CM | POA: Insufficient documentation

## 2011-09-17 DIAGNOSIS — R197 Diarrhea, unspecified: Secondary | ICD-10-CM | POA: Insufficient documentation

## 2011-09-17 DIAGNOSIS — Z8505 Personal history of malignant neoplasm of liver: Secondary | ICD-10-CM | POA: Insufficient documentation

## 2011-09-17 NOTE — Progress Notes (Signed)
This office note has been dictated.

## 2011-09-17 NOTE — Patient Instructions (Signed)
MARYCARMEN HAGEY  [id number] Feb 15, 1941   [facility]  Discharge Instructions  RECOMMENDATIONS MADE BY THE CONSULTANT AND ANY TEST RESULTS WILL BE SENT TO YOUR REFERRING DOCTOR.   EXAM FINDINGS BY MD TODAY AND SIGNS AND SYMPTOMS TO REPORT TO CLINIC OR PRIMARY MD: Your tumor has come back and you have involvement in your liver and in the mesentary.  It is not operable.  Option would be to use sandostatin injections monthly to help decrease the flushing and diarrhea.  In about 15-18% of people we get some tumor shrinkage.  Think about it and call us Monday and let us know what you want to do. Tobie Lords, RN [phone]).  MEDICATIONS PRESCRIBED: none     SPECIAL INSTRUCTIONS/FOLLOW-UP: Return to Clinic: To be arranged.   I acknowledge that I have been informed and understand all the instructions given to me and received a copy. I do not have any more questions at this time, but understand that I may call the Specialty Clinic at [facility] at [phone] during business hours should I have any further questions or need assistance in obtaining follow-up care.    __________________________________________  _____________  __________ Signature of Patient or Authorized Representative            Date                   Time    __________________________________________ Nurse's Signature

## 2011-09-17 NOTE — Progress Notes (Signed)
CC:   Jeneen Montgomery, MD  DIAGNOSES: 1. 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  She has a very     elevated serum serotonin level to over 1600 and she has an elevated     5-HIAA level in her urine. 2. Hypertension. 3. Mild obesity. 4. Leg cramps. 5. History of smoking though she quit many years ago. Terria is here with her youngest daughter and went over the scans, showed her the key films showing the liver lesions, mesenteric disease, retroperitoneal adenopathy and we went over her laboratory work.  After a long discussion that was approximately 20-25 minutes, basically involved all of counseling, showing films, discussing films, answering questions, she has not decided whether she wants to take any therapy.  I have offered her Sandostatin at least to begin with and then she will think about this and let us know by Monday.  I would do Sandostatin monthly for 4-6 months, see if her serotonin level comes down, repeat her scan if necessary, and then consider capecitabine-based therapy or other therapy if we can find something out there that might work.    ______________________________ Ladona Horns. Mariel Sleet, MD ESN/MEDQ  D:  09/17/2011  T:  09/17/2011  Job:  [id number]

## 2011-09-20 ENCOUNTER — Encounter (HOSPITAL_COMMUNITY): Payer: Self-pay | Admitting: Oncology

## 2011-09-20 ENCOUNTER — Telehealth (HOSPITAL_COMMUNITY): Payer: Self-pay

## 2011-09-20 DIAGNOSIS — D3A00 Benign carcinoid tumor of unspecified site: Secondary | ICD-10-CM | POA: Insufficient documentation

## 2011-09-20 HISTORY — DX: Benign carcinoid tumor of unspecified site: D3A.00

## 2011-09-22 ENCOUNTER — Ambulatory Visit (HOSPITAL_COMMUNITY)
Admission: RE | Admit: 2011-09-22 | Discharge: 2011-09-22 | Disposition: A | Discharge status: Home / Self Care | Payer: Medicare HMO | Source: Ambulatory Visit | Attending: Oncology | Admitting: Oncology

## 2011-09-22 ENCOUNTER — Other Ambulatory Visit (HOSPITAL_COMMUNITY): Payer: Self-pay | Admitting: Oncology

## 2011-09-22 DIAGNOSIS — R928 Other abnormal and inconclusive findings on diagnostic imaging of breast: Secondary | ICD-10-CM

## 2011-09-28 ENCOUNTER — Telehealth (HOSPITAL_COMMUNITY): Payer: Self-pay

## 2011-09-28 ENCOUNTER — Other Ambulatory Visit (HOSPITAL_COMMUNITY): Payer: Self-pay | Admitting: Oncology

## 2011-09-28 DIAGNOSIS — D3A00 Benign carcinoid tumor of unspecified site: Secondary | ICD-10-CM

## 2011-09-28 NOTE — Telephone Encounter (Signed)
Call from patient and she is willing to start Sandostatin injections.  Will need supportive plan built.

## 2011-10-05 ENCOUNTER — Encounter (HOSPITAL_COMMUNITY): Payer: Medicare HMO | Attending: Oncology

## 2011-10-05 ENCOUNTER — Other Ambulatory Visit (HOSPITAL_COMMUNITY): Payer: Self-pay | Admitting: Oncology

## 2011-10-05 DIAGNOSIS — C7A00 Malignant carcinoid tumor of unspecified site: Secondary | ICD-10-CM

## 2011-10-05 DIAGNOSIS — C787 Secondary malignant neoplasm of liver and intrahepatic bile duct: Secondary | ICD-10-CM

## 2011-10-05 DIAGNOSIS — D3A00 Benign carcinoid tumor of unspecified site: Secondary | ICD-10-CM | POA: Insufficient documentation

## 2011-10-05 MED ORDER — OCTREOTIDE ACETATE 30 MG IM KIT
30.0000 mg | PACK | Freq: Once | INTRAMUSCULAR | Status: AC
  Administered 2011-10-05: 30 mg via INTRAMUSCULAR
  Filled 2011-10-05: qty 1

## 2011-10-05 NOTE — Progress Notes (Signed)
Sarah Casey presents today for injection per MD orders. Sandostatin 30mg administered IM in left Gluteal. Administration without incident. Patient tolerated well.  

## 2011-10-15 ENCOUNTER — Telehealth (HOSPITAL_COMMUNITY): Payer: Self-pay | Admitting: Oncology

## 2011-10-28 ENCOUNTER — Encounter (HOSPITAL_COMMUNITY): Payer: Medicare HMO | Attending: Oncology

## 2011-10-28 DIAGNOSIS — D3A00 Benign carcinoid tumor of unspecified site: Secondary | ICD-10-CM | POA: Insufficient documentation

## 2011-10-28 NOTE — Progress Notes (Signed)
Lab draw today

## 2011-11-01 LAB — SEROTONIN SERUM: Serotonin, Serum: 2017 ng/mL — ABNORMAL HIGH (ref 56–244)

## 2011-11-04 ENCOUNTER — Encounter (HOSPITAL_BASED_OUTPATIENT_CLINIC_OR_DEPARTMENT_OTHER): Payer: Medicare HMO

## 2011-11-04 VITALS — BP 127/75 | HR 67 | Temp 98.0°F

## 2011-11-04 DIAGNOSIS — D3A00 Benign carcinoid tumor of unspecified site: Secondary | ICD-10-CM

## 2011-11-04 DIAGNOSIS — C7A00 Malignant carcinoid tumor of unspecified site: Secondary | ICD-10-CM

## 2011-11-04 DIAGNOSIS — C787 Secondary malignant neoplasm of liver and intrahepatic bile duct: Secondary | ICD-10-CM

## 2011-11-04 MED ORDER — OCTREOTIDE ACETATE 30 MG IM KIT
30.0000 mg | PACK | Freq: Once | INTRAMUSCULAR | Status: AC
  Administered 2011-11-04: 30 mg via INTRAMUSCULAR
  Filled 2011-11-04: qty 1

## 2011-11-04 NOTE — Progress Notes (Signed)
Sarah Casey presents today for injection per MD orders. Sandostatin  administered IM in right Gluteal. Administration without incident. Patient tolerated well.

## 2011-11-29 ENCOUNTER — Encounter (HOSPITAL_COMMUNITY): Payer: Medicare HMO | Attending: Oncology

## 2011-11-29 DIAGNOSIS — D3A00 Benign carcinoid tumor of unspecified site: Secondary | ICD-10-CM | POA: Insufficient documentation

## 2011-11-29 NOTE — Progress Notes (Signed)
Lab draw

## 2011-12-02 ENCOUNTER — Encounter (HOSPITAL_BASED_OUTPATIENT_CLINIC_OR_DEPARTMENT_OTHER): Payer: Medicare HMO

## 2011-12-02 VITALS — BP 149/72 | HR 74 | Temp 98.1°F

## 2011-12-02 DIAGNOSIS — C787 Secondary malignant neoplasm of liver and intrahepatic bile duct: Secondary | ICD-10-CM

## 2011-12-02 DIAGNOSIS — D3A00 Benign carcinoid tumor of unspecified site: Secondary | ICD-10-CM

## 2011-12-02 DIAGNOSIS — C7A00 Malignant carcinoid tumor of unspecified site: Secondary | ICD-10-CM

## 2011-12-02 MED ORDER — OCTREOTIDE ACETATE 30 MG IM KIT
30.0000 mg | PACK | Freq: Once | INTRAMUSCULAR | Status: AC
  Administered 2011-12-02: 30 mg via INTRAMUSCULAR
  Filled 2011-12-02: qty 1

## 2011-12-02 NOTE — Progress Notes (Signed)
Sarah Casey presents today for injection per MD orders. Sandostatin 30mg  administered IM in left Gluteal. Administration without incident. Patient tolerated well.

## 2011-12-03 ENCOUNTER — Other Ambulatory Visit (HOSPITAL_COMMUNITY): Payer: Self-pay | Admitting: Oncology

## 2011-12-03 LAB — SEROTONIN SERUM: Serotonin, Serum: 1316 ng/mL — ABNORMAL HIGH (ref 56–244)

## 2011-12-21 ENCOUNTER — Encounter (HOSPITAL_COMMUNITY): Payer: Self-pay | Admitting: Oncology

## 2011-12-21 ENCOUNTER — Encounter (HOSPITAL_BASED_OUTPATIENT_CLINIC_OR_DEPARTMENT_OTHER): Payer: Medicare HMO | Admitting: Oncology

## 2011-12-21 VITALS — BP 170/73 | HR 86 | Temp 98.1°F | Wt 190.0 lb

## 2011-12-21 DIAGNOSIS — C7B8 Other secondary neuroendocrine tumors: Secondary | ICD-10-CM

## 2011-12-21 DIAGNOSIS — C787 Secondary malignant neoplasm of liver and intrahepatic bile duct: Secondary | ICD-10-CM

## 2011-12-21 DIAGNOSIS — D3A00 Benign carcinoid tumor of unspecified site: Secondary | ICD-10-CM

## 2011-12-21 DIAGNOSIS — C7A098 Malignant carcinoid tumors of other sites: Secondary | ICD-10-CM

## 2011-12-21 NOTE — Patient Instructions (Addendum)
[facility]  Discharge Instructions Sarah Casey  [id number] 31-Jul-1940 Dr. Glenford Peers   RECOMMENDATIONS MADE BY THE CONSULTANT AND ANY TEST RESULTS WILL BE SENT TO YOUR REFERRING DOCTOR.   EXAM FINDINGS BY MD TODAY AND SIGNS AND SYMPTOMS TO REPORT TO CLINIC OR PRIMARY MD:  We will get a copy of your 2010-2011 scans from [facility]  MEDICATIONS PRESCRIBED: Dr. Mariel Sleet has increased your dose of the sandostatin to 40 mg monthly   INSTRUCTIONS GIVEN AND DISCUSSED: Labs and shot monthly  SPECIAL INSTRUCTIONS/FOLLOW-UP: 4 months to see the Dr.   I acknowledge that I have been informed and understand all the instructions given to me and received a copy. I do not have any more questions at this time, but understand that I may call the Specialty Clinic at [facility] at [phone] during business hours should I have any further questions or need assistance in obtaining follow-up care.    __________________________________________  _____________  __________ Signature of Patient or Authorized Representative            Date                   Time    __________________________________________ Nurse's Signature

## 2011-12-21 NOTE — Progress Notes (Signed)
Problem #1 recurrent carcinoid to liver and retroperitoneal nodes mesenteric nodes with mesenteric stranding presented with a very high serotonin level greater than 1600 and an elevated 5 HIAA level in her urine she is now on octreotide 30 mg monthly which I will increase to 40 mg to see if that will decrease her serotonin levels even further. She occasionally still has a diarrhea episode but no skin flushing  She is accompanied by her daughter and their main concern was how fast the change change in her liver occurred from 2011 to 2013. They would like me to send for the films and I will do that. We need a release of information signed and we will get that done. She will call me in 2 weeks after you get the films.  Her vital signs are excellent She looks great and right now has only that occasional loose stool. Her abdomen is soft nontender she has a hernia within the incision site on the right upper quadrant bowel sounds are very normal. She has no obvious liver palpability at this time. She has no leg edema no inguinal nodes. So she looks great we'll see her back in about 4 months and we'll see how the shots treat her.

## 2011-12-24 ENCOUNTER — Telehealth (HOSPITAL_COMMUNITY): Payer: Self-pay | Admitting: Oncology

## 2011-12-24 NOTE — Telephone Encounter (Signed)
[facility] [phone] ?'D IF CHANGE FROM 30MG  TO 40MG  OF SANDOSTATIN WOULD REQUIRE A NEW AUTH. PER MAX A NEW AUTH IS REQUIRED. FILLED OUT AUTH FORM AND FAXED IT TO  [phone]

## 2011-12-29 ENCOUNTER — Encounter (HOSPITAL_COMMUNITY): Payer: Medicare HMO | Attending: Oncology

## 2011-12-29 DIAGNOSIS — C787 Secondary malignant neoplasm of liver and intrahepatic bile duct: Secondary | ICD-10-CM

## 2011-12-29 DIAGNOSIS — D3A00 Benign carcinoid tumor of unspecified site: Secondary | ICD-10-CM

## 2011-12-29 DIAGNOSIS — C7A00 Malignant carcinoid tumor of unspecified site: Secondary | ICD-10-CM

## 2011-12-29 NOTE — Progress Notes (Signed)
Lab draw

## 2011-12-31 ENCOUNTER — Encounter (HOSPITAL_BASED_OUTPATIENT_CLINIC_OR_DEPARTMENT_OTHER): Payer: Medicare HMO

## 2011-12-31 VITALS — BP 137/66 | HR 61 | Temp 98.0°F

## 2011-12-31 DIAGNOSIS — C7A00 Malignant carcinoid tumor of unspecified site: Secondary | ICD-10-CM

## 2011-12-31 DIAGNOSIS — D3A00 Benign carcinoid tumor of unspecified site: Secondary | ICD-10-CM

## 2011-12-31 DIAGNOSIS — C787 Secondary malignant neoplasm of liver and intrahepatic bile duct: Secondary | ICD-10-CM

## 2011-12-31 DIAGNOSIS — E340 Carcinoid syndrome: Secondary | ICD-10-CM

## 2011-12-31 MED ORDER — OCTREOTIDE ACETATE 30 MG IM KIT
30.0000 mg | PACK | Freq: Once | INTRAMUSCULAR | Status: AC
  Administered 2011-12-31: 30 mg via INTRAMUSCULAR
  Filled 2011-12-31: qty 1

## 2011-12-31 MED ORDER — OCTREOTIDE ACETATE 30 MG IM KIT
40.0000 mg | PACK | Freq: Once | INTRAMUSCULAR | Status: DC
  Filled 2011-12-31: qty 2

## 2011-12-31 NOTE — Progress Notes (Signed)
Sarah Casey presents today for injection per MD orders. Sandostatin 30mg  administered IM in right Gluteal. Administration without incident. Patient tolerated well.  FYI (Sandostatin 40mg  unavailable from pharmacy. MD ordered Sandostatin 30mg  IM this dose ONLY.)

## 2012-01-28 ENCOUNTER — Encounter (HOSPITAL_COMMUNITY): Payer: Medicare HMO | Attending: Oncology

## 2012-01-28 DIAGNOSIS — D3A00 Benign carcinoid tumor of unspecified site: Secondary | ICD-10-CM | POA: Insufficient documentation

## 2012-01-28 NOTE — Progress Notes (Signed)
Labs drawn today for serotonin 

## 2012-01-31 ENCOUNTER — Encounter (HOSPITAL_BASED_OUTPATIENT_CLINIC_OR_DEPARTMENT_OTHER): Payer: Medicare HMO

## 2012-01-31 VITALS — BP 141/72 | HR 57 | Temp 97.0°F | Wt 186.8 lb

## 2012-01-31 DIAGNOSIS — C787 Secondary malignant neoplasm of liver and intrahepatic bile duct: Secondary | ICD-10-CM

## 2012-01-31 DIAGNOSIS — E340 Carcinoid syndrome: Secondary | ICD-10-CM

## 2012-01-31 DIAGNOSIS — D3A00 Benign carcinoid tumor of unspecified site: Secondary | ICD-10-CM

## 2012-01-31 DIAGNOSIS — C7A00 Malignant carcinoid tumor of unspecified site: Secondary | ICD-10-CM

## 2012-01-31 MED ORDER — OCTREOTIDE ACETATE 20 MG IM KIT
40.0000 mg | PACK | Freq: Once | INTRAMUSCULAR | Status: AC
  Administered 2012-01-31: 40 mg via INTRAMUSCULAR
  Filled 2012-01-31: qty 2

## 2012-01-31 NOTE — Progress Notes (Signed)
Tolerated injections well.  Sandostatin 4o mg in divided doses of 20 mg each given to rt and lt ventrogluteal muscles.

## 2012-02-02 LAB — SEROTONIN SERUM: Serotonin, Serum: 1771 ng/mL — ABNORMAL HIGH (ref 56–244)

## 2012-02-29 ENCOUNTER — Encounter (HOSPITAL_COMMUNITY): Payer: Medicare HMO | Attending: Oncology

## 2012-02-29 DIAGNOSIS — C7A00 Malignant carcinoid tumor of unspecified site: Secondary | ICD-10-CM

## 2012-02-29 DIAGNOSIS — C787 Secondary malignant neoplasm of liver and intrahepatic bile duct: Secondary | ICD-10-CM

## 2012-02-29 DIAGNOSIS — D3A00 Benign carcinoid tumor of unspecified site: Secondary | ICD-10-CM

## 2012-02-29 LAB — COMPREHENSIVE METABOLIC PANEL
AST: 18 U/L (ref 0–37)
Albumin: 4 g/dL (ref 3.5–5.2)
Alkaline Phosphatase: 112 U/L (ref 39–117)
BUN: 20 mg/dL (ref 6–23)
Chloride: 105 mEq/L (ref 96–112)
Creatinine, Ser: 0.59 mg/dL (ref 0.50–1.10)
Potassium: 3.4 mEq/L — ABNORMAL LOW (ref 3.5–5.1)
Total Protein: 7.2 g/dL (ref 6.0–8.3)

## 2012-02-29 LAB — CBC
HCT: 39.1 % (ref 36.0–46.0)
MCHC: 33 g/dL (ref 30.0–36.0)
Platelets: 335 10*3/uL (ref 150–400)
RDW: 14.4 % (ref 11.5–15.5)
WBC: 5.7 10*3/uL (ref 4.0–10.5)

## 2012-02-29 NOTE — Progress Notes (Signed)
Labs drawn today for cbc,cmp,serotonin 

## 2012-03-01 ENCOUNTER — Telehealth (HOSPITAL_COMMUNITY): Payer: Self-pay | Admitting: *Deleted

## 2012-03-01 NOTE — Telephone Encounter (Signed)
k-dur 10 meq bid for 7 days then one a day # 50 Ref x 3 Called  to [employer]. Patient notified.

## 2012-03-03 ENCOUNTER — Encounter (HOSPITAL_BASED_OUTPATIENT_CLINIC_OR_DEPARTMENT_OTHER): Payer: Medicare HMO

## 2012-03-03 VITALS — BP 157/64 | HR 81 | Temp 97.8°F

## 2012-03-03 DIAGNOSIS — C7A00 Malignant carcinoid tumor of unspecified site: Secondary | ICD-10-CM

## 2012-03-03 DIAGNOSIS — D3A00 Benign carcinoid tumor of unspecified site: Secondary | ICD-10-CM

## 2012-03-03 DIAGNOSIS — C787 Secondary malignant neoplasm of liver and intrahepatic bile duct: Secondary | ICD-10-CM

## 2012-03-03 DIAGNOSIS — E340 Carcinoid syndrome: Secondary | ICD-10-CM

## 2012-03-03 MED ORDER — OCTREOTIDE ACETATE 20 MG IM KIT
40.0000 mg | PACK | Freq: Once | INTRAMUSCULAR | Status: AC
  Administered 2012-03-03: 40 mg via INTRAMUSCULAR
  Filled 2012-03-03: qty 2

## 2012-03-03 NOTE — Progress Notes (Signed)
Sandostatin Lar 40 mg given in divided doses to right and left ventrogluteal muscles.Tolerated well.

## 2012-03-04 LAB — SEROTONIN SERUM: Serotonin, Serum: 1411 ng/mL — ABNORMAL HIGH (ref 56–244)

## 2012-03-30 ENCOUNTER — Encounter (HOSPITAL_COMMUNITY): Payer: Medicare HMO | Attending: Oncology

## 2012-03-30 DIAGNOSIS — C7A00 Malignant carcinoid tumor of unspecified site: Secondary | ICD-10-CM

## 2012-03-30 DIAGNOSIS — C787 Secondary malignant neoplasm of liver and intrahepatic bile duct: Secondary | ICD-10-CM

## 2012-03-30 DIAGNOSIS — D3A00 Benign carcinoid tumor of unspecified site: Secondary | ICD-10-CM | POA: Insufficient documentation

## 2012-03-30 NOTE — Progress Notes (Signed)
Labs drawn today for Serotonin 

## 2012-04-03 ENCOUNTER — Encounter (HOSPITAL_BASED_OUTPATIENT_CLINIC_OR_DEPARTMENT_OTHER): Payer: Medicare HMO

## 2012-04-03 VITALS — BP 174/90 | HR 69 | Temp 98.5°F | Resp 18

## 2012-04-03 DIAGNOSIS — D3A00 Benign carcinoid tumor of unspecified site: Secondary | ICD-10-CM

## 2012-04-03 DIAGNOSIS — E3400 Carcinoid syndrome, unspecified: Secondary | ICD-10-CM

## 2012-04-03 MED ORDER — OCTREOTIDE ACETATE 20 MG IM KIT
40.0000 mg | PACK | Freq: Once | INTRAMUSCULAR | Status: AC
  Administered 2012-04-03: 40 mg via INTRAMUSCULAR
  Filled 2012-04-03: qty 2

## 2012-04-03 NOTE — Progress Notes (Signed)
Tolerated im injections well.

## 2012-04-20 LAB — SEROTONIN SERUM: Serotonin, Serum: 1499 ng/mL — ABNORMAL HIGH (ref 56–244)

## 2012-04-21 ENCOUNTER — Ambulatory Visit (HOSPITAL_COMMUNITY): Payer: Medicare HMO | Admitting: Oncology

## 2012-04-28 ENCOUNTER — Encounter (HOSPITAL_COMMUNITY): Payer: Medicare HMO | Attending: Oncology | Admitting: Oncology

## 2012-04-28 ENCOUNTER — Other Ambulatory Visit (HOSPITAL_COMMUNITY): Payer: Medicare HMO

## 2012-04-28 ENCOUNTER — Ambulatory Visit (HOSPITAL_COMMUNITY)
Admission: RE | Admit: 2012-04-28 | Discharge: 2012-04-28 | Disposition: A | Discharge status: Home / Self Care | Payer: Medicare HMO | Source: Ambulatory Visit | Attending: Oncology | Admitting: Oncology

## 2012-04-28 VITALS — BP 154/71 | HR 78 | Temp 97.9°F | Resp 16 | Wt 186.2 lb

## 2012-04-28 DIAGNOSIS — C7A00 Malignant carcinoid tumor of unspecified site: Secondary | ICD-10-CM

## 2012-04-28 DIAGNOSIS — M79609 Pain in unspecified limb: Secondary | ICD-10-CM | POA: Insufficient documentation

## 2012-04-28 DIAGNOSIS — C787 Secondary malignant neoplasm of liver and intrahepatic bile duct: Secondary | ICD-10-CM

## 2012-04-28 DIAGNOSIS — M7989 Other specified soft tissue disorders: Secondary | ICD-10-CM | POA: Insufficient documentation

## 2012-04-28 DIAGNOSIS — Z86718 Personal history of other venous thrombosis and embolism: Secondary | ICD-10-CM | POA: Insufficient documentation

## 2012-04-28 DIAGNOSIS — F17200 Nicotine dependence, unspecified, uncomplicated: Secondary | ICD-10-CM | POA: Insufficient documentation

## 2012-04-28 DIAGNOSIS — D3A00 Benign carcinoid tumor of unspecified site: Secondary | ICD-10-CM

## 2012-04-28 DIAGNOSIS — R05 Cough: Secondary | ICD-10-CM

## 2012-04-28 NOTE — Patient Instructions (Signed)
[facility]  Discharge Instructions  RECOMMENDATIONS MADE BY THE CONSULTANT AND ANY TEST RESULTS WILL BE SENT TO YOUR REFERRING DOCTOR.   EXAM FINDINGS BY MD TODAY AND SIGNS AND SYMPTOMS TO REPORT TO CLINIC OR PRIMARY MD: worsening of leg pain and swelling, shortness of breath, chest pain.       SPECIAL INSTRUCTIONS/FOLLOW-UP: Lab work needed Monday, please be sure to fast prior to lab work.   Xray studies needed today, see the radiology department on your way out.  Return to Clinic in three months, please see the front desk as you leave for appointments.   I acknowledge that I have been informed and understand all the instructions given to me and received a copy. I do not have any more questions at this time, but understand that I may call the Specialty Clinic at [facility] at [phone] during business hours should I have any further questions or need assistance in obtaining follow-up care.    __________________________________________  _____________  __________ Signature of Patient or Authorized Representative            Date                   Time    __________________________________________ Nurse's Signature

## 2012-04-28 NOTE — Progress Notes (Signed)
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 She had a very high serotonin level greater than 1600 with some improvement now on octreotide 40 mg monthly for the last several months. She rarely has diarrhea. She had 2-3 days of diarrhea last week and took Imodium and has not moved her bowels more than once a day since. She looks very good today. She is in rare 4. She still smoking. She still coughing. She does not bring up any phlegm. She has not had fever or chills. Her abdomen remains soft and nontender and her vital signs are very stable. She has lost 4 pounds in 5 months. Bowel sounds are very normal to slightly active today. Once again I cannot feel her liver. She has no lymph nodes enlarged in the inguinal areas and no leg edema other than the left leg is swollen compared to the right. She has a prior history of clot the left leg. Is no tenderness to the calf but steadily larger than the right. We need to check her for clot. She is having cramps in her legs more on the left than the right. We will check her potassium and magnesium and calcium today.  Otherwise we will continue the octreotide monthly and consider a scan of her abdomen in 3 months to 4 months.

## 2012-05-01 ENCOUNTER — Ambulatory Visit (HOSPITAL_COMMUNITY): Payer: Medicare HMO

## 2012-05-02 ENCOUNTER — Encounter (HOSPITAL_BASED_OUTPATIENT_CLINIC_OR_DEPARTMENT_OTHER): Payer: Medicare HMO

## 2012-05-02 DIAGNOSIS — E340 Carcinoid syndrome: Secondary | ICD-10-CM

## 2012-05-02 DIAGNOSIS — D3A00 Benign carcinoid tumor of unspecified site: Secondary | ICD-10-CM

## 2012-05-02 DIAGNOSIS — C787 Secondary malignant neoplasm of liver and intrahepatic bile duct: Secondary | ICD-10-CM

## 2012-05-02 DIAGNOSIS — C7A00 Malignant carcinoid tumor of unspecified site: Secondary | ICD-10-CM

## 2012-05-02 DIAGNOSIS — M7989 Other specified soft tissue disorders: Secondary | ICD-10-CM

## 2012-05-02 LAB — COMPREHENSIVE METABOLIC PANEL
Alkaline Phosphatase: 116 U/L (ref 39–117)
BUN: 20 mg/dL (ref 6–23)
CO2: 29 mEq/L (ref 19–32)
Chloride: 104 mEq/L (ref 96–112)
GFR calc Af Amer: >90 mL/min (ref >90)
GFR calc non Af Amer: 85 mL/min — ABNORMAL LOW (ref >90)
Glucose, Bld: 99 mg/dL (ref 70–99)
Potassium: 4.1 mEq/L (ref 3.5–5.1)
Total Bilirubin: 0.3 mg/dL (ref 0.3–1.2)

## 2012-05-02 LAB — CBC WITH DIFFERENTIAL/PLATELET
HCT: 40 % (ref 36.0–46.0)
Hemoglobin: 13.3 g/dL (ref 12.0–15.0)
Lymphs Abs: 1.6 10*3/uL (ref 0.7–4.0)
Monocytes Absolute: 0.5 10*3/uL (ref 0.1–1.0)
Monocytes Relative: 8 % (ref 3–12)
Neutro Abs: 3.6 10*3/uL (ref 1.7–7.7)
Neutrophils Relative %: 61 % (ref 43–77)
RBC: 4.53 MIL/uL (ref 3.87–5.11)

## 2012-05-02 LAB — MAGNESIUM: Magnesium: 2.5 mg/dL (ref 1.5–2.5)

## 2012-05-02 MED ORDER — OCTREOTIDE ACETATE 30 MG IM KIT
40.0000 mg | PACK | Freq: Once | INTRAMUSCULAR | Status: AC
  Administered 2012-05-02: 40 mg via INTRAMUSCULAR
  Filled 2012-05-02: qty 2

## 2012-05-02 NOTE — Progress Notes (Signed)
Sarah Casey presents today for injection per MD orders. Sandostatin LAR 40mg   administered IM in left and right Gluteal in divided doses. Administration without incident. Patient tolerated well.

## 2012-05-02 NOTE — Progress Notes (Signed)
Labs drawn today for cbc/difff,cmp,mg,serotonin

## 2012-05-09 ENCOUNTER — Other Ambulatory Visit (HOSPITAL_COMMUNITY): Payer: Self-pay | Admitting: *Deleted

## 2012-06-01 ENCOUNTER — Encounter (HOSPITAL_COMMUNITY): Payer: Medicare HMO | Attending: Oncology

## 2012-06-01 ENCOUNTER — Encounter (HOSPITAL_COMMUNITY): Payer: Medicare HMO

## 2012-06-01 DIAGNOSIS — C7A00 Malignant carcinoid tumor of unspecified site: Secondary | ICD-10-CM

## 2012-06-01 DIAGNOSIS — D3A00 Benign carcinoid tumor of unspecified site: Secondary | ICD-10-CM | POA: Insufficient documentation

## 2012-06-01 DIAGNOSIS — C787 Secondary malignant neoplasm of liver and intrahepatic bile duct: Secondary | ICD-10-CM

## 2012-06-01 NOTE — Progress Notes (Signed)
Labs drawn today for Serotonin.  Per patient request she wanted to hold Sandostatin until she sees  Dr Mariel Sleet on Monday December 9th.

## 2012-06-05 ENCOUNTER — Encounter (HOSPITAL_COMMUNITY): Payer: Self-pay | Admitting: Oncology

## 2012-06-05 ENCOUNTER — Encounter (HOSPITAL_BASED_OUTPATIENT_CLINIC_OR_DEPARTMENT_OTHER): Payer: Medicare HMO | Admitting: Oncology

## 2012-06-05 VITALS — BP 143/78 | HR 70 | Temp 97.0°F | Resp 16 | Wt 182.8 lb

## 2012-06-05 DIAGNOSIS — D3A00 Benign carcinoid tumor of unspecified site: Secondary | ICD-10-CM

## 2012-06-05 DIAGNOSIS — C7A00 Malignant carcinoid tumor of unspecified site: Secondary | ICD-10-CM

## 2012-06-05 DIAGNOSIS — C787 Secondary malignant neoplasm of liver and intrahepatic bile duct: Secondary | ICD-10-CM

## 2012-06-05 DIAGNOSIS — C7B8 Other secondary neuroendocrine tumors: Secondary | ICD-10-CM

## 2012-06-05 DIAGNOSIS — E340 Carcinoid syndrome: Secondary | ICD-10-CM

## 2012-06-05 MED ORDER — OCTREOTIDE ACETATE 30 MG IM KIT
40.0000 mg | PACK | Freq: Once | INTRAMUSCULAR | Status: AC
  Administered 2012-06-05: 40 mg via INTRAMUSCULAR
  Filled 2012-06-05: qty 2

## 2012-06-05 NOTE — Patient Instructions (Addendum)
[facility] Discharge Instructions  RECOMMENDATIONS MADE BY THE CONSULTANT AND ANY TEST RESULTS WILL BE SENT TO YOUR REFERRING PHYSICIAN.  EXAM FINDINGS BY THE PHYSICIAN TODAY AND SIGNS OR SYMPTOMS TO REPORT TO CLINIC OR PRIMARY PHYSICIAN: discussion by MD.  Want to see if you are eligible for clinical trial at [facility].  Will get you some information for you to review and then you can decide what you want to do.  We will continue with the Sandosatin injections for now.  MEDICATIONS PRESCRIBED:  none  SPECIAL INSTRUCTIONS/FOLLOW-UP: Return for follow-up in 1 month.  Thank you for choosing [facility] to provide your oncology and hematology care.  To afford each patient quality time with our providers, please arrive at least 15 minutes before your scheduled appointment time.  With your help, our goal is to use those 15 minutes to complete the necessary work-up to ensure our physicians have the information they need to help with your evaluation and healthcare recommendations.    Effective January 1st, 2014, we ask that you re-schedule your appointment with our physicians should you arrive 10 or more minutes late for your appointment.  We strive to give you quality time with our providers, and arriving late affects you and other patients whose appointments are after yours.    Again, thank you for choosing [facility].  Our hope is that these requests will decrease the amount of time that you wait before being seen by our physicians.       _____________________________________________________________  I acknowledge that I have been informed and understand all the instructions given to me and received a copy. I do not have anymore questions at this time but understand that I may call the Cancer Center at [facility] at [phone] during business hours should I have any further questions or need assistance in obtaining follow-up  care.    __________________________________________  _____________  __________ Signature of Patient or Authorized Representative            Date                   Time    __________________________________________ Nurse's Signature

## 2012-06-05 NOTE — Progress Notes (Signed)
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 Her recent serotonin levels are rising in spite of octreotide 40 mg monthly. She still rarely has any diarrhea. Has 3 stools a day 1 is normal to her slightly soft, slightly loose at best. She has good weight good appetite. She is lost 4 pounds in a month. She sleeps well, does whatever she wants to do around the house. She is accompanied by her daughter.  Vital signs are stable otherwise. Her lungs are clear. She has no lymphadenopathy in the cervical, supraclavicular, interclavicular, axillary, or inguinal areas. Her heart shows a regular rhythm and rate. I did not hear a murmur. Abdomen is soft. I still cannot feel her liver edge. Bowel sounds are normal. She has no peripheral edema. She has no obvious ascites.  She is alert and oriented.  I wonder if she is possibly a candidate for the carcinoid study at [facility] under Dr. Lattie Corns. We will again that since she has expressed some interest. Tentatively we will see her in 4-6 weeks. Her labs are due today.

## 2012-06-05 NOTE — Progress Notes (Signed)
Sarah Casey presents today for injection per MD orders. Sandostatin 20 mg given IM Z-track in left hip and 20 mg given IM Z-track in right hip. Administration without incident. Patient tolerated well.

## 2012-06-07 LAB — SEROTONIN SERUM: Serotonin, Serum: 1887

## 2012-06-07 MED ORDER — SODIUM CHLORIDE 0.9 % IJ SOLN
INTRAMUSCULAR | Status: AC
  Filled 2012-06-07: qty 10

## 2012-07-03 ENCOUNTER — Encounter (HOSPITAL_COMMUNITY): Payer: Medicare HMO | Attending: Oncology

## 2012-07-03 DIAGNOSIS — D3A00 Benign carcinoid tumor of unspecified site: Secondary | ICD-10-CM | POA: Insufficient documentation

## 2012-07-03 DIAGNOSIS — C7A00 Malignant carcinoid tumor of unspecified site: Secondary | ICD-10-CM

## 2012-07-03 NOTE — Progress Notes (Signed)
Labs drawn today for serotonin

## 2012-07-07 ENCOUNTER — Encounter (HOSPITAL_COMMUNITY): Payer: Medicare HMO

## 2012-07-07 ENCOUNTER — Encounter (HOSPITAL_COMMUNITY): Payer: Self-pay | Admitting: Oncology

## 2012-07-07 ENCOUNTER — Encounter (HOSPITAL_BASED_OUTPATIENT_CLINIC_OR_DEPARTMENT_OTHER): Payer: Medicare HMO | Admitting: Oncology

## 2012-07-07 VITALS — BP 130/79 | HR 65 | Temp 97.9°F | Resp 18 | Wt 185.7 lb

## 2012-07-07 DIAGNOSIS — E3400 Carcinoid syndrome, unspecified: Secondary | ICD-10-CM

## 2012-07-07 DIAGNOSIS — C787 Secondary malignant neoplasm of liver and intrahepatic bile duct: Secondary | ICD-10-CM

## 2012-07-07 DIAGNOSIS — C7B8 Other secondary neuroendocrine tumors: Secondary | ICD-10-CM

## 2012-07-07 DIAGNOSIS — C7A00 Malignant carcinoid tumor of unspecified site: Secondary | ICD-10-CM

## 2012-07-07 DIAGNOSIS — D3A00 Benign carcinoid tumor of unspecified site: Secondary | ICD-10-CM

## 2012-07-07 LAB — SEROTONIN SERUM: Serotonin, Serum: 1419 ng/mL — ABNORMAL HIGH (ref 56–244)

## 2012-07-07 MED ORDER — OCTREOTIDE ACETATE 30 MG IM KIT
40.0000 mg | PACK | Freq: Once | INTRAMUSCULAR | Status: AC
  Administered 2012-07-07: 40 mg via INTRAMUSCULAR
  Filled 2012-07-07: qty 2

## 2012-07-07 NOTE — Progress Notes (Signed)
Sarah Casey presents today for injection per MD orders Sandostatin 40 mg administered IM ztrack in divided doses  in left and right Gluteal. Administration without incident. Patient tolerated well.

## 2012-07-07 NOTE — Progress Notes (Signed)
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 She is here today with her daughter. She is on Sandostatin 20 mg once a month. Unfortunately she was not a candidate for the [facility] carcinoid steady. She remains asymptomatic presently except for rare flush.  She looks great otherwise an oncology review of systems is negative. She doesn't look anxious depressed her anything. Vital signs are stable. Her abdomen remains soft and nontender I cannot feel her liver rate. I cannot feel her spleen. Bowel sounds are normal. She is no ascites. She has no distention. She has no leg edema etc. Her heart shows a regular rhythm and rate without murmur or gallop.  Therefore we'll continue therapy for palliation only. Do not think she is a candidate for chemotherapy. She does not want chemotherapy. We will see her in a couple months. We will continue her monthly lab work. We await her CAT scan in the near future.

## 2012-07-07 NOTE — Patient Instructions (Addendum)
[facility] Discharge Instructions  RECOMMENDATIONS MADE BY THE CONSULTANT AND ANY TEST RESULTS WILL BE SENT TO YOUR REFERRING PHYSICIAN.  EXAM FINDINGS BY THE PHYSICIAN TODAY AND SIGNS OR SYMPTOMS TO REPORT TO CLINIC OR PRIMARY PHYSICIAN: exam and discussion by MD.  Will continue with current therapy  MEDICATIONS PRESCRIBED:  none  INSTRUCTIONS GIVEN AND DISCUSSED: Report increased diarrhea or other symptoms.  SPECIAL INSTRUCTIONS/FOLLOW-UP: Serotonin level every 4 weeks and injection every 4 weeks.  CT of abdomen and pelvis in February and to see MD after scans.  Thank you for choosing [facility] to provide your oncology and hematology care.  To afford each patient quality time with our providers, please arrive at least 15 minutes before your scheduled appointment time.  With your help, our goal is to use those 15 minutes to complete the necessary work-up to ensure our physicians have the information they need to help with your evaluation and healthcare recommendations.    Effective January 1st, 2014, we ask that you re-schedule your appointment with our physicians should you arrive 10 or more minutes late for your appointment.  We strive to give you quality time with our providers, and arriving late affects you and other patients whose appointments are after yours.    Again, thank you for choosing [facility].  Our hope is that these requests will decrease the amount of time that you wait before being seen by our physicians.       _____________________________________________________________  Should you have questions after your visit to [facility], please contact our office at [phone] between the hours of 8:30 a.m. and 5:00 p.m.  Voicemails left after 4:30 p.m. will not be returned until the following business day.  For prescription refill requests, have your pharmacy contact our office with your prescription refill  request.

## 2012-07-31 ENCOUNTER — Encounter (HOSPITAL_COMMUNITY): Payer: Medicare HMO | Attending: Oncology

## 2012-07-31 DIAGNOSIS — C7B8 Other secondary neuroendocrine tumors: Secondary | ICD-10-CM

## 2012-07-31 DIAGNOSIS — C787 Secondary malignant neoplasm of liver and intrahepatic bile duct: Secondary | ICD-10-CM

## 2012-07-31 DIAGNOSIS — C7A00 Malignant carcinoid tumor of unspecified site: Secondary | ICD-10-CM

## 2012-07-31 DIAGNOSIS — D3A00 Benign carcinoid tumor of unspecified site: Secondary | ICD-10-CM | POA: Insufficient documentation

## 2012-07-31 LAB — CBC
HCT: 39.8 % (ref 36.0–46.0)
Hemoglobin: 13 g/dL (ref 12.0–15.0)
MCH: 28.6 pg (ref 26.0–34.0)
MCHC: 32.7 g/dL (ref 30.0–36.0)

## 2012-07-31 LAB — COMPREHENSIVE METABOLIC PANEL
Alkaline Phosphatase: 120 U/L — ABNORMAL HIGH (ref 39–117)
BUN: 17 mg/dL (ref 6–23)
GFR calc Af Amer: >90 mL/min (ref >90)
Glucose, Bld: 97 mg/dL (ref 70–99)
Potassium: 3.8 mEq/L (ref 3.5–5.1)
Total Protein: 7 g/dL (ref 6.0–8.3)

## 2012-07-31 NOTE — Progress Notes (Signed)
Labs drawn today for cbc,cmp,serotonin

## 2012-08-05 LAB — SEROTONIN SERUM: Serotonin, Serum: 1379 ng/mL — ABNORMAL HIGH (ref 56–244)

## 2012-08-08 ENCOUNTER — Encounter (HOSPITAL_BASED_OUTPATIENT_CLINIC_OR_DEPARTMENT_OTHER): Payer: Medicare HMO

## 2012-08-08 VITALS — BP 139/88 | HR 84 | Temp 98.0°F | Resp 18

## 2012-08-08 DIAGNOSIS — C7A00 Malignant carcinoid tumor of unspecified site: Secondary | ICD-10-CM

## 2012-08-08 DIAGNOSIS — C7B8 Other secondary neuroendocrine tumors: Secondary | ICD-10-CM

## 2012-08-08 DIAGNOSIS — D3A00 Benign carcinoid tumor of unspecified site: Secondary | ICD-10-CM

## 2012-08-08 DIAGNOSIS — C787 Secondary malignant neoplasm of liver and intrahepatic bile duct: Secondary | ICD-10-CM

## 2012-08-08 MED ORDER — OCTREOTIDE ACETATE 20 MG IM KIT
40.0000 mg | PACK | Freq: Once | INTRAMUSCULAR | Status: AC
  Administered 2012-08-08: 40 mg via INTRAMUSCULAR
  Filled 2012-08-08: qty 2

## 2012-08-08 NOTE — Progress Notes (Signed)
Sarah Casey presents today for injection per the provider's orders.  Sandostatin administered administration without incident; see MAR for injection details.  Patient tolerated procedure well and without incident.  No questions or complaints noted at this time.

## 2012-08-14 ENCOUNTER — Ambulatory Visit (HOSPITAL_COMMUNITY)
Admission: RE | Admit: 2012-08-14 | Discharge: 2012-08-14 | Disposition: A | Discharge status: Home / Self Care | Payer: Medicare HMO | Source: Ambulatory Visit | Attending: Oncology | Admitting: Oncology

## 2012-08-14 ENCOUNTER — Other Ambulatory Visit (HOSPITAL_COMMUNITY): Payer: Medicare HMO

## 2012-08-14 DIAGNOSIS — C787 Secondary malignant neoplasm of liver and intrahepatic bile duct: Secondary | ICD-10-CM | POA: Insufficient documentation

## 2012-08-14 DIAGNOSIS — D3A00 Benign carcinoid tumor of unspecified site: Secondary | ICD-10-CM | POA: Insufficient documentation

## 2012-08-14 DIAGNOSIS — R599 Enlarged lymph nodes, unspecified: Secondary | ICD-10-CM | POA: Insufficient documentation

## 2012-08-14 MED ORDER — IOHEXOL 300 MG/ML  SOLN
100.0000 mL | Freq: Once | INTRAMUSCULAR | Status: AC | PRN
  Administered 2012-08-14: 100 mL via INTRAVENOUS

## 2012-08-15 ENCOUNTER — Encounter (HOSPITAL_BASED_OUTPATIENT_CLINIC_OR_DEPARTMENT_OTHER): Payer: Medicare HMO | Admitting: Oncology

## 2012-08-15 VITALS — BP 128/72 | HR 75 | Temp 97.9°F | Resp 20 | Wt 185.0 lb

## 2012-08-15 DIAGNOSIS — C7A00 Malignant carcinoid tumor of unspecified site: Secondary | ICD-10-CM

## 2012-08-15 DIAGNOSIS — C7B8 Other secondary neuroendocrine tumors: Secondary | ICD-10-CM

## 2012-08-15 DIAGNOSIS — D3A00 Benign carcinoid tumor of unspecified site: Secondary | ICD-10-CM

## 2012-08-15 DIAGNOSIS — C787 Secondary malignant neoplasm of liver and intrahepatic bile duct: Secondary | ICD-10-CM

## 2012-08-15 NOTE — Progress Notes (Signed)
Problem #1 metastatic recurrent carcinoid to liver, retroperitoneal lymph nodes, mesenteric lymph nodes, with mesenteric stranding. She is on Sandostatin 40 mg monthly. She has had a nice drop in her serotonin levels but interestingly her CAT scan the other day shows progression of disease in the liver and lymph node bearing areas.  She still feels great toe. She has no diarrhea. She has no flushing. Appetite is excellent.  I cannot feel her liver on physical exam in her vital signs are stable. Heart shows a regular rhythm and rate without murmur rub or gallop. She has no leg edema. She is no abdominal distention. Bowel sounds are normal.  She is somewhat confusing but clearly appears have progression by CT scan criteria but not by liver enzyme criteria or by serotonin levels.  Since she is doing very well, I will continue the Sandostatin 40 mg monthly and consider repeating her CAT scan 6 months this time. She may at some point be a candidate for capecitabine and temozolomide chemotherapy when she fails for certain clinically and radiographically.

## 2012-08-15 NOTE — Patient Instructions (Addendum)
[facility] Discharge Instructions  RECOMMENDATIONS MADE BY THE CONSULTANT AND ANY TEST RESULTS WILL BE SENT TO YOUR REFERRING PHYSICIAN.  EXAM FINDINGS BY THE PHYSICIAN TODAY AND SIGNS OR SYMPTOMS TO REPORT TO CLINIC OR PRIMARY PHYSICIAN: Discussion by MD.  Will not make any changes will continue with monthly blood work and sandostatin injections.  Your blood marker is good but your scan is a little confusing.  We will base our treatment on your marker and how you feel.  MEDICATIONS PRESCRIBED:  none     SPECIAL INSTRUCTIONS/FOLLOW-UP: Monthly blood work and injections and to see MD in 12 weeks.  Thank you for choosing [facility] to provide your oncology and hematology care.  To afford each patient quality time with our providers, please arrive at least 15 minutes before your scheduled appointment time.  With your help, our goal is to use those 15 minutes to complete the necessary work-up to ensure our physicians have the information they need to help with your evaluation and healthcare recommendations.    Effective January 1st, 2014, we ask that you re-schedule your appointment with our physicians should you arrive 10 or more minutes late for your appointment.  We strive to give you quality time with our providers, and arriving late affects you and other patients whose appointments are after yours.    Again, thank you for choosing [facility].  Our hope is that these requests will decrease the amount of time that you wait before being seen by our physicians.       _____________________________________________________________  Should you have questions after your visit to [facility], please contact our office at [phone] between the hours of 8:30 a.m. and 5:00 p.m.  Voicemails left after 4:30 p.m. will not be returned until the following business day.  For prescription refill requests, have your pharmacy contact our office  with your prescription refill request.

## 2012-09-01 ENCOUNTER — Other Ambulatory Visit (HOSPITAL_COMMUNITY): Payer: Medicare HMO

## 2012-09-05 ENCOUNTER — Encounter (HOSPITAL_BASED_OUTPATIENT_CLINIC_OR_DEPARTMENT_OTHER): Payer: Medicare HMO

## 2012-09-05 ENCOUNTER — Encounter (HOSPITAL_COMMUNITY): Payer: Medicare HMO | Attending: Oncology

## 2012-09-05 VITALS — BP 136/83 | HR 68 | Temp 97.4°F | Resp 18

## 2012-09-05 DIAGNOSIS — D3A00 Benign carcinoid tumor of unspecified site: Secondary | ICD-10-CM | POA: Insufficient documentation

## 2012-09-05 DIAGNOSIS — C7A00 Malignant carcinoid tumor of unspecified site: Secondary | ICD-10-CM

## 2012-09-05 MED ORDER — OCTREOTIDE ACETATE 30 MG IM KIT
40.0000 mg | PACK | Freq: Once | INTRAMUSCULAR | Status: AC
  Administered 2012-09-05: 40 mg via INTRAMUSCULAR
  Filled 2012-09-05: qty 2

## 2012-09-05 NOTE — Progress Notes (Signed)
Tolerated Sandostatin 40 mg in divided doses to right and left evntrogluteal muscles IM.  No c/o discomfort. Lot #[id number] Exp. Feb 2016

## 2012-09-05 NOTE — Progress Notes (Signed)
Labs drawn today for Serotonin

## 2012-09-06 ENCOUNTER — Other Ambulatory Visit (HOSPITAL_COMMUNITY): Payer: Medicare HMO

## 2012-09-10 LAB — SEROTONIN SERUM: Serotonin, Serum: 1458 ng/mL — ABNORMAL HIGH (ref 56–244)

## 2012-10-05 ENCOUNTER — Other Ambulatory Visit (HOSPITAL_COMMUNITY): Payer: Self-pay | Admitting: Family Medicine

## 2012-10-06 ENCOUNTER — Encounter: Payer: Self-pay | Admitting: *Deleted

## 2012-10-11 ENCOUNTER — Other Ambulatory Visit (HOSPITAL_COMMUNITY): Payer: Self-pay | Admitting: Oncology

## 2012-10-11 ENCOUNTER — Encounter (HOSPITAL_COMMUNITY): Payer: Medicare HMO | Attending: Oncology

## 2012-10-11 ENCOUNTER — Encounter (HOSPITAL_BASED_OUTPATIENT_CLINIC_OR_DEPARTMENT_OTHER): Payer: Medicare HMO

## 2012-10-11 VITALS — BP 136/72 | HR 72

## 2012-10-11 DIAGNOSIS — C7B8 Other secondary neuroendocrine tumors: Secondary | ICD-10-CM

## 2012-10-11 DIAGNOSIS — C7A00 Malignant carcinoid tumor of unspecified site: Secondary | ICD-10-CM

## 2012-10-11 DIAGNOSIS — E340 Carcinoid syndrome: Secondary | ICD-10-CM

## 2012-10-11 DIAGNOSIS — C787 Secondary malignant neoplasm of liver and intrahepatic bile duct: Secondary | ICD-10-CM

## 2012-10-11 DIAGNOSIS — D3A00 Benign carcinoid tumor of unspecified site: Secondary | ICD-10-CM | POA: Insufficient documentation

## 2012-10-11 LAB — COMPREHENSIVE METABOLIC PANEL
AST: 23 U/L (ref 0–37)
Albumin: 3.8 g/dL (ref 3.5–5.2)
Alkaline Phosphatase: 169 U/L — ABNORMAL HIGH (ref 39–117)
Chloride: 101 mEq/L (ref 96–112)
Creatinine, Ser: 0.71 mg/dL (ref 0.50–1.10)
Potassium: 3.9 mEq/L (ref 3.5–5.1)
Total Bilirubin: 0.4 mg/dL (ref 0.3–1.2)
Total Protein: 7.3 g/dL (ref 6.0–8.3)

## 2012-10-11 LAB — DIFFERENTIAL
Basophils Relative: 0 % (ref 0–1)
Lymphs Abs: 1.4 10*3/uL (ref 0.7–4.0)
Monocytes Absolute: 0.5 10*3/uL (ref 0.1–1.0)
Monocytes Relative: 7 % (ref 3–12)
Neutro Abs: 4.9 10*3/uL (ref 1.7–7.7)

## 2012-10-11 LAB — CBC
MCHC: 33.5 g/dL (ref 30.0–36.0)
Platelets: 368 10*3/uL (ref 150–400)
RDW: 14.3 % (ref 11.5–15.5)
WBC: 7 10*3/uL (ref 4.0–10.5)

## 2012-10-11 MED ORDER — OCTREOTIDE ACETATE 20 MG IM KIT
40.0000 mg | PACK | Freq: Once | INTRAMUSCULAR | Status: AC
  Administered 2012-10-11: 40 mg via INTRAMUSCULAR
  Filled 2012-10-11: qty 2

## 2012-10-11 NOTE — Progress Notes (Signed)
Received sandostatin lar 40 mg im z-track in right and left ventrogluteal muscles in divided doses. Tolerated well.

## 2012-10-11 NOTE — Progress Notes (Signed)
Labs drawn today for cbc/dif,cmp,serotonin

## 2012-10-12 ENCOUNTER — Ambulatory Visit (INDEPENDENT_AMBULATORY_CARE_PROVIDER_SITE_OTHER): Payer: Medicare HMO | Admitting: Family Medicine

## 2012-10-12 ENCOUNTER — Encounter: Payer: Self-pay | Admitting: Family Medicine

## 2012-10-12 VITALS — BP 130/80 | HR 80 | Ht 63.5 in | Wt 184.0 lb

## 2012-10-12 DIAGNOSIS — J309 Allergic rhinitis, unspecified: Secondary | ICD-10-CM

## 2012-10-12 DIAGNOSIS — K219 Gastro-esophageal reflux disease without esophagitis: Secondary | ICD-10-CM

## 2012-10-12 DIAGNOSIS — I10 Essential (primary) hypertension: Secondary | ICD-10-CM

## 2012-10-12 DIAGNOSIS — D3A00 Benign carcinoid tumor of unspecified site: Secondary | ICD-10-CM

## 2012-10-12 MED ORDER — RANITIDINE HCL 300 MG PO CAPS: 300.0000 mg | ORAL_CAPSULE | Freq: Two times a day (BID) | ORAL | Status: DC

## 2012-10-12 MED ORDER — AMLODIPINE BESYLATE 5 MG PO TABS: 5.0000 mg | ORAL_TABLET | Freq: Every day | ORAL | Status: DC

## 2012-10-12 NOTE — Patient Instructions (Signed)
Can try daily allegra or zyrtec for chronic drainage

## 2012-10-12 NOTE — Progress Notes (Signed)
  Subjective:    Patient ID: Sarah Casey, female    DOB: [birth year], 72 y.o.   MRN: [id number]  Hypertension The current episode started more than 1 year ago. The problem is unchanged. The problem is controlled. Associated symptoms include malaise/fatigue. Pertinent negatives include no chest pain, neck pain, palpitations or shortness of breath. There are no associated agents to hypertension. There are no known risk factors for coronary artery disease. Past treatments include calcium channel blockers. The current treatment provides moderate improvement. There are no compliance problems.  There is no history of angina or CAD/MI.   Some heart burn ad belching lately. Some caffeine intake. Some allergy congestion and drainage lately. claritin kep t awake  Review of Systems  Constitutional: Positive for malaise/fatigue.  HENT: Negative for neck pain.   Respiratory: Negative for shortness of breath.   Cardiovascular: Negative for chest pain and palpitations.   ROS otherwise negative.     Objective:   Physical Exam  Alert no acute distress. Distinct obesity noted. HEENT slight nasal congestion. Vitals reviewed. Lungs clear. Heart regular rate and rhythm.      Assessment & Plan:  Impression #1 hypertension good control. #2 reflux stable on medicines. Discussed. #3 allergic rhinitis discussed. #4 chronic carcinoid tumor followed by specialist. Plan diet exercise discussed. Meds refilled. Compliance issues discussed. Check in 6 months. 25 minutes spent most in discussion. WSL

## 2012-10-15 DIAGNOSIS — K219 Gastro-esophageal reflux disease without esophagitis: Secondary | ICD-10-CM | POA: Insufficient documentation

## 2012-10-15 DIAGNOSIS — I10 Essential (primary) hypertension: Secondary | ICD-10-CM | POA: Insufficient documentation

## 2012-10-15 DIAGNOSIS — J309 Allergic rhinitis, unspecified: Secondary | ICD-10-CM | POA: Insufficient documentation

## 2012-11-10 ENCOUNTER — Encounter (HOSPITAL_COMMUNITY): Payer: Medicare HMO | Attending: Oncology

## 2012-11-10 ENCOUNTER — Other Ambulatory Visit (HOSPITAL_COMMUNITY): Payer: Self-pay | Admitting: Oncology

## 2012-11-10 ENCOUNTER — Encounter (HOSPITAL_BASED_OUTPATIENT_CLINIC_OR_DEPARTMENT_OTHER): Payer: Medicare HMO

## 2012-11-10 DIAGNOSIS — E340 Carcinoid syndrome: Secondary | ICD-10-CM

## 2012-11-10 DIAGNOSIS — C7B8 Other secondary neuroendocrine tumors: Secondary | ICD-10-CM

## 2012-11-10 DIAGNOSIS — C7A00 Malignant carcinoid tumor of unspecified site: Secondary | ICD-10-CM

## 2012-11-10 DIAGNOSIS — C787 Secondary malignant neoplasm of liver and intrahepatic bile duct: Secondary | ICD-10-CM

## 2012-11-10 DIAGNOSIS — D3A00 Benign carcinoid tumor of unspecified site: Secondary | ICD-10-CM

## 2012-11-10 LAB — DIFFERENTIAL
Basophils Absolute: 0.1 10*3/uL (ref 0.0–0.1)
Eosinophils Absolute: 0.4 10*3/uL (ref 0.0–0.7)
Eosinophils Relative: 6 % — ABNORMAL HIGH (ref 0–5)
Lymphs Abs: 1.4 10*3/uL (ref 0.7–4.0)
Neutrophils Relative %: 63 % (ref 43–77)

## 2012-11-10 LAB — COMPREHENSIVE METABOLIC PANEL
Alkaline Phosphatase: 138 U/L — ABNORMAL HIGH (ref 39–117)
BUN: 21 mg/dL (ref 6–23)
CO2: 26 mEq/L (ref 19–32)
Calcium: 8.9 mg/dL (ref 8.4–10.5)
GFR calc Af Amer: >90 mL/min (ref >90)
GFR calc non Af Amer: 81 mL/min — ABNORMAL LOW (ref >90)
Glucose, Bld: 111 mg/dL — ABNORMAL HIGH (ref 70–99)
Potassium: 3.5 mEq/L (ref 3.5–5.1)
Total Protein: 7.4 g/dL (ref 6.0–8.3)

## 2012-11-10 LAB — CBC
HCT: 39.1 % (ref 36.0–46.0)
Hemoglobin: 12.9 g/dL (ref 12.0–15.0)
MCH: 28.7 pg (ref 26.0–34.0)
MCHC: 33 g/dL (ref 30.0–36.0)
RBC: 4.5 MIL/uL (ref 3.87–5.11)

## 2012-11-10 MED ORDER — OCTREOTIDE ACETATE 20 MG IM KIT
40.0000 mg | PACK | Freq: Once | INTRAMUSCULAR | Status: AC
  Administered 2012-11-10: 40 mg via INTRAMUSCULAR
  Filled 2012-11-10: qty 2

## 2012-11-10 NOTE — Progress Notes (Signed)
Sarah Casey presents today for injection per MD orders. Sandostatin 40mg  administered IM in left and right Gluteal in divided doses. Administration without incident. Patient tolerated well.

## 2012-11-10 NOTE — Progress Notes (Signed)
Labs drawn today for cbc/diff,cmp,serotonin

## 2012-11-14 NOTE — Progress Notes (Signed)
Sarah Asa, MD [address] [place] [address]  Metastatic recurrent carcinoid - Plan: Serotonin serum, CBC with Differential, Comprehensive metabolic panel, CT Abdomen Pelvis W Contrast, TSH  CURRENT THERAPY: Sandostatin 40 mg every 4 weeks.   INTERVAL HISTORY: Sarah Casey 72 y.o. 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 She is on Sandostatin 40 mg monthly. She has had a nice drop in her serotonin levels but most recently these levels have climbed and her CT scan in Feb 2014 showed progression of disease in the liver and lymph node bearing areas.  I personally reviewed and went over laboratory results with the patient.  Her CBC is unremarkable with a normal differential.  Her metabolic panel is noted for WNL AST and ALT, mildly elevated, yet stable, Alk Phos, and otherwise unremarkable. Serum serotonin is still pending at this time.   She reports continued diarrhea which is stable.  She admits to 3-4 loose stools daily.  She is taking Lomotil for this.  She denies any worsening of this symptom.   She reports that she fatigues easier than in the past.  She asks about her thyroid which we did not check.  She has requested this be tested. I will add a TSH to her next lab draw and if abnormal will defer treatment to PCP.   She reports that she is doing well from an oncologic standpoint.  She does not clearly complain of flushing.  She denies any heart palpitations.  She admits to diarrhea.     Past Medical History  Diagnosis Date  . Fracture of ankle     right  . Hypertension   . Ulcer   . 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 She has a very elevated serum serotonin level to  over 1600 and she has an elevated 5-HIAA level in her urine.   . PUD (peptic ulcer disease)     has Metastatic recurrent carcinoid; Essential hypertension, benign; Esophageal reflux; and Allergic rhinitis on her problem list.     is allergic to lodine and vasotec.  Ms. Thornley had no medications administered during this visit.  Past Surgical History  Procedure Laterality Date  . Dilation and curettage of uterus    . Cholecystectomy    . Hernia repair      umbilical  . Ankle debridement      right for staff infection    Denies any headaches, dizziness, double vision, fevers, chills, night sweats, nausea, vomiting, diarrhea, constipation, chest pain, heart palpitations, shortness of breath, blood in stool, black tarry stool, urinary pain, urinary burning, urinary frequency, hematuria.   PHYSICAL EXAMINATION  ECOG PERFORMANCE STATUS: 1 - Symptomatic but completely ambulatory  Filed Vitals:   11/15/12 1336  BP: 144/72  Pulse: 68  Temp: 98.2 F (36.8 C)  Resp: 18    GENERAL:alert, no distress, well nourished, well developed, comfortable, cooperative, obese and smiling SKIN: skin color, texture, turgor are normal, no rashes or significant lesions HEAD: Normocephalic, No masses, lesions, tenderness or abnormalities EYES: normal, Conjunctiva are pink and non-injected EARS: External ears normal OROPHARYNX:mucous membranes are moist  NECK: supple, no adenopathy, thyroid normal size, non-tender, without nodularity, no stridor, non-tender, trachea midline LYMPH:  no palpable lymphadenopathy, no hepatosplenomegaly BREAST:not  examined LUNGS: clear to auscultation and percussion HEART: regular rate & rhythm, no murmurs, no gallops, S1 normal and S2 normal ABDOMEN:abdomen soft, non-tender, obese, normal bowel sounds, no masses or organomegaly, surgical scar noted and is well healed and no hepatosplenomegaly BACK: Back symmetric, no curvature., No CVA tenderness EXTREMITIES:less then  2 second capillary refill, no joint deformities, effusion, or inflammation, no edema, no skin discoloration, no clubbing, no cyanosis  NEURO: alert & oriented x 3 with fluent speech, no focal motor/sensory deficits, gait normal    LABORATORY DATA: CBC    Component Value Date/Time   WBC 5.9 11/10/2012 1023   RBC 4.50 11/10/2012 1023   HGB 12.9 11/10/2012 1023   HCT 39.1 11/10/2012 1023   PLT 347 11/10/2012 1023   MCV 86.9 11/10/2012 1023   MCH 28.7 11/10/2012 1023   MCHC 33.0 11/10/2012 1023   RDW 14.3 11/10/2012 1023   LYMPHSABS 1.4 11/10/2012 1023   MONOABS 0.4 11/10/2012 1023   EOSABS 0.4 11/10/2012 1023   BASOSABS 0.1 11/10/2012 1023      Chemistry      Component Value Date/Time   NA 142 11/10/2012 1023   K 3.5 11/10/2012 1023   CL 105 11/10/2012 1023   CO2 26 11/10/2012 1023   BUN 21 11/10/2012 1023   CREATININE 0.77 11/10/2012 1023      Component Value Date/Time   CALCIUM 8.9 11/10/2012 1023   ALKPHOS 138* 11/10/2012 1023   AST 18 11/10/2012 1023   ALT 13 11/10/2012 1023   BILITOT 0.1* 11/10/2012 1023     Results for ANET, LOGSDON (MRN [id number]) as of 11/14/2012 09:36  Ref. Range 07/31/2012 10:43 08/14/2012 12:08 09/05/2012 11:02 10/11/2012 10:53  Serotonin, Serum Latest Range: 56-244 ng/mL 1379 (H)  1458 (H) 1725 (H)     RADIOGRAPHIC STUDIES:  08/14/2012  *RADIOLOGY REPORT*  Clinical Data: Carcinoid tumor  CT ABDOMEN AND PELVIS WITH CONTRAST  Technique: Multidetector CT imaging of the abdomen and pelvis was  performed following the standard protocol during bolus  administration of intravenous contrast.  Contrast: OMNIPAQUE IOHEXOL 300 MG/ML SOLN  Comparison: 09/03/2011  Findings: Scattered pulmonary subpleural and parenchymal not walls  in the lung bases are stable. 4 mm index right middle lobe nodule  measured on the previous study is stable at 4 mm. 6 mm nodule in  the posterior right costophrenic sulcus (image 18) measures success  7 mm today compared 6 mm  previously. 4 mm nodule in the central  left lower lobe (image 13) is unchanged. Other scattered tiny  nodules are not substantially changed in the interval. No definite  new nodules are identified.  4.9 cm lesion in the dome of the medial segment left liver has  increased in size from 2.2 cm previously. 2.8 cm lesion in the  lateral segment of the left liver was not visible previously. 7.5  x 5.8 cm lesion in the posterior region of the lateral segment left  liver measured 7.0 x 5.2 cm previously. The intra and extrahepatic  biliary duct dilatation is stable in the interval.  Spleen is unremarkable. The stomach, duodenum, pancreas, and  adrenal glands are normal.  Lymphadenopathy in the root of the small bowel mesentery markedly  attenuates the superior mesenteric vein. The portal vein and  splenic vein remain patent.  10 mm left retrocrural lymph node was only 5 mm previously. 2.9 x  3.0 cm left para-aortic nodal conglomeration encases the  posterolateral aorta and measured 2.0 x 1.9 cm  previously. Other  para-aortic retroperitoneal lymphadenopathy has progressed in the  interval.  The kidneys are unremarkable. No evidence for bowel obstruction.  No free fluid in the abdomen. In several fascial defects within the  anterior abdominal wall contain areas of herniated fat, but no  bowel.  Imaging through the pelvis shows no free intraperitoneal fluid. No  pelvic sidewall lymphadenopathy. Uterus is unremarkable. No  adnexal mass. Scattered diverticular change noted in the left  colon without diverticulitis. Terminal ileum and appendix are  normal.  Subtle sclerotic lesions in the lumbar spine raise concern for new  bony metastatic involvement.  IMPRESSION:  Interval progression of metastatic disease in the liver.  Interval progression of retrocrural and retroperitoneal  lymphadenopathy, consistent with metastatic disease.  Interval progression of lymphadenopathy in the mesenteric  root,  consistent with metastatic involvement.  No substantial progression of tiny bilateral lung nodules.  Continued attention on follow-up imaging recommended.  Original Report Authenticated By: Kennith Center, M.D.     ASSESSMENT:  1. 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 She is on Sandostatin 40 mg monthly. She has had a nice drop in her serotonin levels but interestingly her CT scan the other day shows progression of disease in the liver and lymph node bearing areas.  Patient Active Problem List   Diagnosis Date Noted  . Essential hypertension, benign 10/15/2012  . Esophageal reflux 10/15/2012  . Allergic rhinitis 10/15/2012  . 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 I personally reviewed and went over laboratory results with the patient. 2. Labs every 4 weeks: CBC diff, CMET, serum serotonin 3. Will check TSH in 4 weeks.  4. Recommend follow-up with PCP as scheduled.  5. CT abd/pelvis with contrast in August 2014 for restaging. 6. Continue with Sandostatin every 4 weeks.  7. She may at some point be a candidate for capecitabine and temozolomide chemotherapy when she fails for certain clinically and radiographically. 8. Return in 3 months for follow-up following CT scan.   All questions were answered. The patient knows to call the clinic with any problems, questions or concerns. We can certainly see the patient much sooner if necessary.   KEFALAS,THOMAS

## 2012-11-15 ENCOUNTER — Encounter (HOSPITAL_COMMUNITY): Payer: Self-pay | Admitting: Oncology

## 2012-11-15 ENCOUNTER — Encounter (HOSPITAL_BASED_OUTPATIENT_CLINIC_OR_DEPARTMENT_OTHER): Payer: Medicare HMO | Admitting: Oncology

## 2012-11-15 VITALS — BP 144/72 | HR 68 | Temp 98.2°F | Resp 18 | Wt 184.1 lb

## 2012-11-15 DIAGNOSIS — C7B8 Other secondary neuroendocrine tumors: Secondary | ICD-10-CM

## 2012-11-15 DIAGNOSIS — C7A00 Malignant carcinoid tumor of unspecified site: Secondary | ICD-10-CM

## 2012-11-15 DIAGNOSIS — C787 Secondary malignant neoplasm of liver and intrahepatic bile duct: Secondary | ICD-10-CM

## 2012-11-15 DIAGNOSIS — D3A00 Benign carcinoid tumor of unspecified site: Secondary | ICD-10-CM

## 2012-11-15 LAB — SEROTONIN SERUM: Serotonin, Serum: 2863 ng/mL — ABNORMAL HIGH (ref 56–244)

## 2012-11-15 NOTE — Patient Instructions (Addendum)
[facility] Discharge Instructions  RECOMMENDATIONS MADE BY THE CONSULTANT AND ANY TEST RESULTS WILL BE SENT TO YOUR REFERRING PHYSICIAN.  Continue lab work every 4 weeks. Continue Sandostatin injections every 4 weeks. CT scan in August 2014. Appointment with MD after CT scan.  Thank you for choosing [facility] to provide your oncology and hematology care.  To afford each patient quality time with our providers, please arrive at least 15 minutes before your scheduled appointment time.  With your help, our goal is to use those 15 minutes to complete the necessary work-up to ensure our physicians have the information they need to help with your evaluation and healthcare recommendations.    Effective January 1st, 2014, we ask that you re-schedule your appointment with our physicians should you arrive 10 or more minutes late for your appointment.  We strive to give you quality time with our providers, and arriving late affects you and other patients whose appointments are after yours.    Again, thank you for choosing [facility].  Our hope is that these requests will decrease the amount of time that you wait before being seen by our physicians.       _____________________________________________________________  Should you have questions after your visit to [facility], please contact our office at [phone] between the hours of 8:30 a.m. and 5:00 p.m.  Voicemails left after 4:30 p.m. will not be returned until the following business day.  For prescription refill requests, have your pharmacy contact our office with your prescription refill request.

## 2012-11-29 ENCOUNTER — Other Ambulatory Visit (HOSPITAL_COMMUNITY): Payer: Self-pay | Admitting: Oncology

## 2012-11-29 DIAGNOSIS — D3A00 Benign carcinoid tumor of unspecified site: Secondary | ICD-10-CM

## 2012-12-08 ENCOUNTER — Encounter (HOSPITAL_COMMUNITY): Payer: Medicare HMO | Attending: Oncology

## 2012-12-08 ENCOUNTER — Encounter (HOSPITAL_BASED_OUTPATIENT_CLINIC_OR_DEPARTMENT_OTHER): Payer: Medicare HMO

## 2012-12-08 VITALS — BP 145/73 | HR 69 | Temp 97.9°F | Resp 18

## 2012-12-08 DIAGNOSIS — E340 Carcinoid syndrome: Secondary | ICD-10-CM

## 2012-12-08 DIAGNOSIS — C787 Secondary malignant neoplasm of liver and intrahepatic bile duct: Secondary | ICD-10-CM

## 2012-12-08 DIAGNOSIS — D3A00 Benign carcinoid tumor of unspecified site: Secondary | ICD-10-CM

## 2012-12-08 DIAGNOSIS — C7A00 Malignant carcinoid tumor of unspecified site: Secondary | ICD-10-CM

## 2012-12-08 LAB — COMPREHENSIVE METABOLIC PANEL
AST: 18 U/L (ref 0–37)
BUN: 21 mg/dL (ref 6–23)
CO2: 27 mEq/L (ref 19–32)
Calcium: 9 mg/dL (ref 8.4–10.5)
Glucose, Bld: 154 mg/dL — ABNORMAL HIGH (ref 70–99)
Sodium: 138 mEq/L (ref 135–145)
Total Bilirubin: 0.4 mg/dL (ref 0.3–1.2)

## 2012-12-08 LAB — CBC WITH DIFFERENTIAL/PLATELET
Eosinophils Relative: 3 % (ref 0–5)
HCT: 41.8 % (ref 36.0–46.0)
Hemoglobin: 13.8 g/dL (ref 12.0–15.0)
Lymphocytes Relative: 25 % (ref 12–46)
Lymphs Abs: 1.5 10*3/uL (ref 0.7–4.0)
MCH: 28.5 pg (ref 26.0–34.0)
MCV: 86.2 fL (ref 78.0–100.0)
Monocytes Absolute: 0.4 10*3/uL (ref 0.1–1.0)
Monocytes Relative: 6 % (ref 3–12)
Platelets: 326 10*3/uL (ref 150–400)
RBC: 4.85 MIL/uL (ref 3.87–5.11)
WBC: 6 10*3/uL (ref 4.0–10.5)

## 2012-12-08 MED ORDER — OCTREOTIDE ACETATE 20 MG IM KIT
40.0000 mg | PACK | Freq: Once | INTRAMUSCULAR | Status: AC
  Administered 2012-12-08: 40 mg via INTRAMUSCULAR
  Filled 2012-12-08: qty 2

## 2012-12-08 NOTE — Progress Notes (Signed)
Labs drawn today for cbc/diff,cmp,serotonin 

## 2012-12-08 NOTE — Progress Notes (Signed)
Sarah Casey presents today for injection per MD orders. Sandostatin 40 mg in 2 equal doses 20 mg each administered IM in left and right upper outer Gluteals. Administration without incident. Patient tolerated well.

## 2013-01-05 ENCOUNTER — Encounter (HOSPITAL_COMMUNITY): Payer: Medicare HMO | Attending: Oncology

## 2013-01-05 ENCOUNTER — Encounter (HOSPITAL_COMMUNITY): Payer: Medicare HMO

## 2013-01-05 VITALS — BP 138/75 | HR 72

## 2013-01-05 DIAGNOSIS — D3A00 Benign carcinoid tumor of unspecified site: Secondary | ICD-10-CM | POA: Insufficient documentation

## 2013-01-05 DIAGNOSIS — C787 Secondary malignant neoplasm of liver and intrahepatic bile duct: Secondary | ICD-10-CM

## 2013-01-05 DIAGNOSIS — E340 Carcinoid syndrome: Secondary | ICD-10-CM

## 2013-01-05 LAB — COMPREHENSIVE METABOLIC PANEL
ALT: 13 U/L (ref 0–35)
BUN: 23 mg/dL (ref 6–23)
CO2: 29 mEq/L (ref 19–32)
Calcium: 9.2 mg/dL (ref 8.4–10.5)
Creatinine, Ser: 0.94 mg/dL (ref 0.50–1.10)
GFR calc Af Amer: 68 mL/min — ABNORMAL LOW (ref >90)
GFR calc non Af Amer: 59 mL/min — ABNORMAL LOW (ref >90)
Glucose, Bld: 80 mg/dL (ref 70–99)
Sodium: 140 mEq/L (ref 135–145)
Total Protein: 6.9 g/dL (ref 6.0–8.3)

## 2013-01-05 LAB — CBC WITH DIFFERENTIAL/PLATELET
Eosinophils Absolute: 0.1 10*3/uL (ref 0.0–0.7)
Eosinophils Relative: 2 % (ref 0–5)
HCT: 36.4 % (ref 36.0–46.0)
Hemoglobin: 12.3 g/dL (ref 12.0–15.0)
Lymphocytes Relative: 21 % (ref 12–46)
Lymphs Abs: 1.4 10*3/uL (ref 0.7–4.0)
MCH: 28.7 pg (ref 26.0–34.0)
MCV: 84.8 fL (ref 78.0–100.0)
Monocytes Absolute: 0.8 10*3/uL (ref 0.1–1.0)
Monocytes Relative: 12 % (ref 3–12)
Platelets: 342 10*3/uL (ref 150–400)
RBC: 4.29 MIL/uL (ref 3.87–5.11)
WBC: 6.6 10*3/uL (ref 4.0–10.5)

## 2013-01-05 LAB — TSH: TSH: 3.731 u[IU]/mL (ref 0.350–4.500)

## 2013-01-05 MED ORDER — OCTREOTIDE ACETATE 20 MG IM KIT
40.0000 mg | PACK | Freq: Once | INTRAMUSCULAR | Status: AC
  Administered 2013-01-05: 40 mg via INTRAMUSCULAR
  Filled 2013-01-05: qty 2

## 2013-01-05 NOTE — Progress Notes (Signed)
sandostatin LAR 4o mg given to right and left buttlocks in divided dosers.  Tolerated well.

## 2013-02-02 ENCOUNTER — Encounter (HOSPITAL_BASED_OUTPATIENT_CLINIC_OR_DEPARTMENT_OTHER): Payer: Medicare HMO

## 2013-02-02 ENCOUNTER — Encounter (HOSPITAL_COMMUNITY): Payer: Medicare HMO | Attending: Oncology

## 2013-02-02 VITALS — BP 142/70 | HR 68

## 2013-02-02 DIAGNOSIS — E3400 Carcinoid syndrome, unspecified: Secondary | ICD-10-CM

## 2013-02-02 DIAGNOSIS — D3A00 Benign carcinoid tumor of unspecified site: Secondary | ICD-10-CM

## 2013-02-02 DIAGNOSIS — C787 Secondary malignant neoplasm of liver and intrahepatic bile duct: Secondary | ICD-10-CM

## 2013-02-02 LAB — DIFFERENTIAL
Eosinophils Absolute: 0.2 10*3/uL (ref 0.0–0.7)
Eosinophils Relative: 4 % (ref 0–5)
Lymphocytes Relative: 27 % (ref 12–46)
Lymphs Abs: 1.5 10*3/uL (ref 0.7–4.0)
Monocytes Absolute: 0.4 10*3/uL (ref 0.1–1.0)

## 2013-02-02 LAB — COMPREHENSIVE METABOLIC PANEL
ALT: 14 U/L (ref 0–35)
AST: 18 U/L (ref 0–37)
Alkaline Phosphatase: 149 U/L — ABNORMAL HIGH (ref 39–117)
CO2: 27 mEq/L (ref 19–32)
Calcium: 9.1 mg/dL (ref 8.4–10.5)
GFR calc non Af Amer: 82 mL/min — ABNORMAL LOW (ref >90)
Potassium: 3.9 mEq/L (ref 3.5–5.1)
Sodium: 139 mEq/L (ref 135–145)
Total Protein: 7.3 g/dL (ref 6.0–8.3)

## 2013-02-02 LAB — CBC
Hemoglobin: 12 g/dL (ref 12.0–15.0)
MCH: 28.4 pg (ref 26.0–34.0)
Platelets: 402 10*3/uL — ABNORMAL HIGH (ref 150–400)
RBC: 4.23 MIL/uL (ref 3.87–5.11)

## 2013-02-02 MED ORDER — OCTREOTIDE ACETATE 20 MG IM KIT
40.0000 mg | PACK | Freq: Once | INTRAMUSCULAR | Status: AC
  Administered 2013-02-02: 40 mg via INTRAMUSCULAR
  Filled 2013-02-02: qty 2

## 2013-02-02 NOTE — Progress Notes (Signed)
Received  Sandostatin LAR 40 mg in right and left buttocks.  Tolerated well.

## 2013-02-02 NOTE — Progress Notes (Signed)
Labs drawn today for cmp,cbc/diff,serotonin

## 2013-02-05 ENCOUNTER — Ambulatory Visit (HOSPITAL_COMMUNITY)
Admission: RE | Admit: 2013-02-05 | Discharge: 2013-02-05 | Disposition: A | Discharge status: Home / Self Care | Payer: Medicare HMO | Source: Ambulatory Visit | Attending: Oncology | Admitting: Oncology

## 2013-02-05 DIAGNOSIS — D3A00 Benign carcinoid tumor of unspecified site: Secondary | ICD-10-CM | POA: Insufficient documentation

## 2013-02-05 DIAGNOSIS — C787 Secondary malignant neoplasm of liver and intrahepatic bile duct: Secondary | ICD-10-CM | POA: Insufficient documentation

## 2013-02-05 DIAGNOSIS — Z09 Encounter for follow-up examination after completed treatment for conditions other than malignant neoplasm: Secondary | ICD-10-CM | POA: Insufficient documentation

## 2013-02-05 MED ORDER — IOHEXOL 300 MG/ML  SOLN
100.0000 mL | Freq: Once | INTRAMUSCULAR | Status: AC | PRN
  Administered 2013-02-05: 100 mL via INTRAVENOUS

## 2013-02-09 ENCOUNTER — Encounter (HOSPITAL_BASED_OUTPATIENT_CLINIC_OR_DEPARTMENT_OTHER): Payer: Medicare HMO | Admitting: Oncology

## 2013-02-09 ENCOUNTER — Encounter (HOSPITAL_COMMUNITY): Payer: Self-pay | Admitting: Oncology

## 2013-02-09 VITALS — BP 147/72 | HR 61 | Temp 97.8°F | Resp 18 | Wt 174.4 lb

## 2013-02-09 DIAGNOSIS — D3A00 Benign carcinoid tumor of unspecified site: Secondary | ICD-10-CM

## 2013-02-09 DIAGNOSIS — R197 Diarrhea, unspecified: Secondary | ICD-10-CM

## 2013-02-09 DIAGNOSIS — C7B8 Other secondary neuroendocrine tumors: Secondary | ICD-10-CM

## 2013-02-09 DIAGNOSIS — C787 Secondary malignant neoplasm of liver and intrahepatic bile duct: Secondary | ICD-10-CM

## 2013-02-09 DIAGNOSIS — C7A00 Malignant carcinoid tumor of unspecified site: Secondary | ICD-10-CM

## 2013-02-09 NOTE — Progress Notes (Signed)
Harlow Asa, MD [address] [place] [address]  Metastatic recurrent carcinoid - Plan: CBC with Differential, Comprehensive metabolic panel, Serotonin serum, CT Abdomen Pelvis W Contrast  CURRENT THERAPY: Octreotide 40 mg every 4 weeks (but changing to 20 mg every 14 day for a 3 month trial for symptom management) and surveillance with serum serotonin.  INTERVAL HISTORY: JEFFRIE STANDER 72 y.o. 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 She continues to display progression of disease by CT scan criteria.    I spent time reviewing NCCN guidelines and recommendations for treatment with progression of disease. Octreotide still remains the standard of care.  Cytotoxic drugs and everolimus remain category 3.  Paytin reports that she continue to have diarrhea.  She has noted a pattern to her diarrhea.  She reports that it improves about 7 days after her octreotide injection and then worsens closer to her time of her next injection.    With this information, I have decided to try Octreotide 20 mg every 14 days and see if this controls her diarrhea better.  She is agreeable to this change.  This will allow for better coverage with regards to peak effect throughout the month.  We change change it back to 40 mg every 4 weeks at any time depending on the patient's response.   She denies any flushing, or heart palpitations.    I personally reviewed and went over laboratory results with the patient.  Her serum serotonin is stable and is maintained within her baseline range over the past 4 months or so but has overall increased and is now noted to be 2817.  Her CBC and metabolic panel are unremarkable and her liver enzymes remain WNL.  Alk Phos is minimally elevated, but stable.  I personally reviewed and went over radiographic studies with the patient.  Her  recent CT of abdomen, full report follows, shows clear progression of disease with new lesions in the liver.  They have nearly doubled in size in some area, but there are also new lesions.  I personally reviewed the films with the patient and her daughter.   I provided the patient education regarding the NCCN guidelines for treatment.  I also educated the patient regarding association of serum serotonin and her diarrhea.  I educated the patient on the mechanism of action and pharmokinetics.  She is agreeable to the change in plan.  She denies any complaints other her diarrhea and oncologically, ROS questioning is negative.    She continues to maintain her own garden at home with vegetables.   Past Medical History  Diagnosis Date  . Fracture of ankle     right  . Hypertension   . Ulcer   . 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 She has a very elevated serum serotonin level to over 1600 and she has an elevated 5-HIAA level in her urine.   . PUD (peptic ulcer disease)     has Metastatic recurrent carcinoid; Essential hypertension, benign; Esophageal reflux; and Allergic rhinitis on her problem list.     is allergic to lodine and vasotec.  Ms. Aufiero does not currently have medications on file.  Past Surgical History  Procedure Laterality Date  . Dilation and  curettage of uterus    . Cholecystectomy    . Hernia repair      umbilical  . Ankle debridement      right for staff infection    Denies any headaches, dizziness, double vision, fevers, chills, night sweats, nausea, vomiting, constipation, chest pain, heart palpitations, shortness of breath, blood in stool, black tarry stool, urinary pain, urinary burning, urinary frequency, hematuria.   PHYSICAL EXAMINATION  ECOG PERFORMANCE STATUS: 1 - Symptomatic but  completely ambulatory  Filed Vitals:   02/09/13 1100  BP: 147/72  Pulse: 61  Temp: 97.8 F (36.6 C)  Resp: 18    GENERAL:alert, no distress, well nourished, well developed, comfortable, cooperative, obese and smiling SKIN: skin color, texture, turgor are normal, no rashes or significant lesions HEAD: Normocephalic, No masses, lesions, tenderness or abnormalities EYES: normal, PERRLA, EOMI, Conjunctiva are pink and non-injected EARS: External ears normal OROPHARYNX:mucous membranes are moist  NECK: supple, no adenopathy, thyroid normal size, non-tender, without nodularity, no stridor, non-tender, trachea midline LYMPH:  no palpable lymphadenopathy BREAST:not examined LUNGS: clear to auscultation and percussion HEART: regular rate & rhythm, no murmurs, no gallops, S1 normal and S2 normal ABDOMEN:abdomen soft, non-tender, obese and normal bowel sounds BACK: Back symmetric, no curvature. EXTREMITIES:less then 2 second capillary refill, no joint deformities, effusion, or inflammation, no edema, no skin discoloration, no clubbing, no cyanosis  NEURO: alert & oriented x 3 with fluent speech, no focal motor/sensory deficits, gait normal   LABORATORY DATA: CBC    Component Value Date/Time   WBC 5.4 02/02/2013 1058   RBC 4.23 02/02/2013 1058   HGB 12.0 02/02/2013 1058   HCT 36.7 02/02/2013 1058   PLT 402* 02/02/2013 1058   MCV 86.8 02/02/2013 1058   MCH 28.4 02/02/2013 1058   MCHC 32.7 02/02/2013 1058   RDW 14.3 02/02/2013 1058   LYMPHSABS 1.5 02/02/2013 1058   MONOABS 0.4 02/02/2013 1058   EOSABS 0.2 02/02/2013 1058   BASOSABS 0.0 02/02/2013 1058      Chemistry      Component Value Date/Time   NA 139 02/02/2013 1058   K 3.9 02/02/2013 1058   CL 102 02/02/2013 1058   CO2 27 02/02/2013 1058   BUN 19 02/02/2013 1058   CREATININE 0.76 02/02/2013 1058      Component Value Date/Time   CALCIUM 9.1 02/02/2013 1058   ALKPHOS 149* 02/02/2013 1058   AST 18 02/02/2013 1058   ALT 14 02/02/2013 1058   BILITOT 0.3 02/02/2013  1058     Results for TAUNIA, FRASCO (MRN [id number]) as of 02/09/2013 12:31  Ref. Range 10/11/2012 10:53 11/10/2012 10:23 12/08/2012 10:45 01/05/2013 13:45 02/02/2013 10:58  Serotonin, Serum Latest Range: 56-244 ng/mL 1725 (H) 2863 (H) 2060 (H) 1752 (H) 2817 (H)     RADIOGRAPHIC STUDIES:  02/05/2013  *RADIOLOGY REPORT*  Clinical Data: Follow-up carcinoid tumor. The patient diagnosed  with a small intestine tumor and liver lesions into 1009 follow-up  with surgery. No complaints at this time.  CT ABDOMEN AND PELVIS WITH CONTRAST  Technique: Multidetector CT imaging of the abdomen and pelvis was  performed following the standard protocol during bolus  administration of intravenous contrast.  Contrast: OMNIPAQUE IOHEXOL 300 MG/ML SOLN  Comparison: 08/14/2012  Findings: Since the prior exam, there has been worsening of  metastatic disease.  There are new liver lesions and multiple other lesions that have  enlarged. The largest liver mass is at the dome of the medial  segment of the left  lobe measuring 6.2 cm in diameter where had  measured 4.9 cm. Near this same axial level in the lateral segment  of the left lobe the liver mass now measures 5.2 cm where had  measured 2.8 cm. The lower attenuation complex cystic mass along  the posterior aspect of the lateral segment of the left lobe is  without significant change. However, adjacent to this, along the  inferior aspect of the left lobe lateral segment near the falciform  ligament there are to new lesions, the largest measuring 2.1 cm in  size. At the dome of the right lobe there are multiple lesions  that are not clearly evident. The largest of these that are well  defined enough for measurement measures 2.3 cm in size. There is a  similar lesion along the lateral margin of the right lobe near the  dome was not evident previously. It measures 2.4 cm in size.  Several smaller lesions are noted in the right lobe that are also    new.  Within an abdominal wall hernia, at the region of the umbilicus,  there is now abnormal soft tissue that measures 2.9 cm x 2.1  centers in size consistent with metastatic disease. There is a fat  containing hernia above this, which is stable. The soft tissue  within the more inferior hernia has increased in size.  Adenopathy in the root of the small bowel mesentery is similar.  Measuring this same positioned between the two studies, there is  evidence of a subtle increase, the current measuring being 18 mm in  the prior 16.4 mm. Retroperitoneal adenopathy has shown a slight  changes well. A node below the superior mesenteric artery anterior  to the aorta measures just under 11 mm in short axis, unchanged. A  node to the left of the aorta below the left renal vein measures  just under 11 mm in short axis, which has enlarged. A  conglomeration of nodes with central calcification to the left of  the aorta at the level of the lower pole of the left kidney is  without significant change allowing for differences in measurement  technique measuring 3 cm x 2.2 cm in greatest transverse dimension.  An enlarged node posterior to the inferior vena cava at the level  of the mid pole of the right kidney previously measured 1 cm short  axis now measuring 1.2 cm.  Lung base nodules are again noted. The largest lies in a  subpleural location adjacent to the hemidiaphragmatic surface of  the posterior right lower lobe measuring 1 cm in longest dimension,  mildly increased. There is a 4 mm nodule in the right middle lobe  that appears slightly increased in size. There is a 7 mm nodule in  the left lower lobe which has increased 4.5 mm previously. There  are multiple other small nodules that are either stable, larger or  new since the prior exam.  The subtle area of sclerosis in the upper lumbar spine described  previously is without significant change. Although this could  reflect metastatic  disease, degenerative sclerosis is felt more  likely. There are no osteolytic lesions.  The spleen, pancreas, adrenal glands, kidneys, ureters and bladder  are unremarkable. The uterus and adnexa are unremarkable.  There is a small amount of free fluid that collects in the  posterior pelvic recess.  IMPRESSION: Worsening metastatic disease. Reportedly, this is from  carcinoid tumor, which is consistent with the pattern.  Specifically, liver metastatic disease has  increased with multiple  lesions being larger and multiple new lesions now evident.  Mesenteric and retroperitoneal adenopathy has a somewhat mixed  appearance, but mostly, there has been an increase in sizes of  metastatic lymph nodes.  There is been increase in size and number of metastatic pulmonary  of lung base nodules.  Abnormal soft tissue within an inferior midline abdominal wall  hernia has increased and is worrisome for metastatic disease as  well.  Original Report Authenticated By: Amie Portland, M.D.    ASSESSMENT:  1. 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 She continues to display progression of disease by CT scan criteria.   2. Diarrhea, secondary to #1.  Will change octreotide frequency and dosing to hopefull help with this.   Patient Active Problem List   Diagnosis Date Noted  . Essential hypertension, benign 10/15/2012  . Esophageal reflux 10/15/2012  . Allergic rhinitis 10/15/2012  . 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 I personally reviewed and went over laboratory results with the patient. 2. I personally reviewed and went over radiographic studies with the patient. 3. CT abd/pelvis with contrast in 6 months 4. Labs monthly: CBC diff, CMET, Serum serotonin 5. Change Octreotide to 20 mg every 14 days. 6. Patient education regarding diarrhea 7. Patient education regarding Octreotide 8.  Return for Octreotide as scheduled 9. NCCN guidelines reviewed with patient.  10. Return in 3 months for follow-up and re-evaluation of treatment plan.   THERAPY PLAN:  Per NCCN guidelines, we will continue with Octreotide.  Cytotoxic chemotherapy and everolimus remain category 3.  Since she remains asymptomatic, will not add everolimus.  I will change her Octreotide to 20 mg every 14 days (compared to 40 mg every 4 weeks) to see if that helps with her diarrhea.  We will re-evaluate in the upcoming months and see if this new treatment schedule is effective for her.   All questions were answered. The patient knows to call the clinic with any problems, questions or concerns. We can certainly see the patient much sooner if necessary.  Patient and plan discussed with Dr. Benita Gutter and he is in agreement with the aforementioned.   Azara Gemme

## 2013-02-09 NOTE — Patient Instructions (Addendum)
[facility] Discharge Instructions  RECOMMENDATIONS MADE BY THE CONSULTANT AND ANY TEST RESULTS WILL BE SENT TO YOUR REFERRING PHYSICIAN.  EXAM FINDINGS BY THE PHYSICIAN TODAY AND SIGNS OR SYMPTOMS TO REPORT TO CLINIC OR PRIMARY PHYSICIAN: Exam and discussion by Dellis Anes PA -C.  We will change your sandostatin injections to 20mg  every 2 weeks and will do this for 3 months to see how you do.  MEDICATIONS PRESCRIBED:  none  INSTRUCTIONS GIVEN AND DISCUSSED: Report worsening diarrhea or other problems.  SPECIAL INSTRUCTIONS/FOLLOW-UP: Blood work every 4 weeks and Sandostatin every 2 weeks and follow-up in 3 months.  Thank you for choosing [facility] to provide your oncology and hematology care.  To afford each patient quality time with our providers, please arrive at least 15 minutes before your scheduled appointment time.  With your help, our goal is to use those 15 minutes to complete the necessary work-up to ensure our physicians have the information they need to help with your evaluation and healthcare recommendations.    Effective January 1st, 2014, we ask that you re-schedule your appointment with our physicians should you arrive 10 or more minutes late for your appointment.  We strive to give you quality time with our providers, and arriving late affects you and other patients whose appointments are after yours.    Again, thank you for choosing [facility].  Our hope is that these requests will decrease the amount of time that you wait before being seen by our physicians.       _____________________________________________________________  Should you have questions after your visit to [facility], please contact our office at [phone] between the hours of 8:30 a.m. and 5:00 p.m.  Voicemails left after 4:30 p.m. will not be returned until the following business day.  For prescription refill requests, have your  pharmacy contact our office with your prescription refill request.

## 2013-02-12 ENCOUNTER — Telehealth (HOSPITAL_COMMUNITY): Payer: Self-pay | Admitting: Dietician

## 2013-02-12 NOTE — Telephone Encounter (Signed)
Nutrition Note  Pt identified on [facility] Nutrition Screen for weight loss, with MST score of 2.   Wt Readings from Last 10 Encounters:  02/09/13 174 lb 6.4 oz (79.107 kg)  11/15/12 184 lb 1.6 oz (83.507 kg)  10/12/12 184 lb (83.462 kg)  08/15/12 185 lb (83.915 kg)  07/07/12 185 lb 11.2 oz (84.233 kg)  06/05/12 182 lb 12.8 oz (82.918 kg)  04/28/12 186 lb 3.2 oz (84.46 kg)  01/31/12 186 lb 12.8 oz (84.732 kg)  12/21/11 190 lb (86.183 kg)  09/17/11 187 lb 11.2 oz (85.14 kg)   Chart reviewed. 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 She is currently on chemo. Unfortunately, per MD notes, recent xrays reveals that cancer has progressed. She is also experiencing intermittent diarrhea.  Wt hx reveals UBW of 185#. Pt has experienced weight loss x 3 months, (5.4% (10#)).  Called pt at 1031. She reports that he weight has always fluctuated. She tells this RD that her lowest weight was 145-150# 39 years ago, when she lost her husband. Per pt, wt fluctuates between 170-190#; she reveals that she is very active and does most of her yard work on her 1 acre lot, which contains a 0.5 acre garden, which she maintains herself. She reports her weight tends to be highest in the winter months and lower in the summer months, as she is more active in the summer months due to yard and garden maintenance.  She reports appetite is variable, but has been good the past few days. She admits to intermittent diarrhea, but reports improvement with rx since last appointment. She denies any recent diet changes. She reports she "eats what she wants". Diet is high in fruits and vegetables from her garden. She reports she only eats meat 1-2 times per week, because she does not like it. However, she receives protein from eggs, beans, and cheese. She also reports that she snacks throughout the day. She has tried Ensure in the past, but is not currently using it, but reports to this RD that she will use it  if her weight continues to decline.  Educated pt on principles of a general, healthful diet. Discussed eating small, frequent meals to improve PO intake. Discussed high protein food sources. Teachback method used.  Pt with no concerns about her diet or nutrition at this time, but very grateful for call. Spent approximately 8 minutes in consultation with pt.  Please do not hesitate to consult RD if further nutrition issues arise.   Glorya Bartley A. Mayford Knife, RD, LDN Pager: [phone]

## 2013-03-01 ENCOUNTER — Ambulatory Visit (HOSPITAL_COMMUNITY): Payer: Medicare HMO

## 2013-03-01 ENCOUNTER — Other Ambulatory Visit (HOSPITAL_COMMUNITY): Payer: Medicare HMO

## 2013-03-02 ENCOUNTER — Ambulatory Visit (HOSPITAL_COMMUNITY): Payer: Medicare HMO

## 2013-03-02 ENCOUNTER — Encounter (HOSPITAL_COMMUNITY): Payer: Medicare HMO

## 2013-03-02 ENCOUNTER — Encounter (HOSPITAL_COMMUNITY): Payer: Medicare HMO | Attending: Oncology

## 2013-03-02 ENCOUNTER — Other Ambulatory Visit (HOSPITAL_COMMUNITY): Payer: Medicare HMO

## 2013-03-02 VITALS — BP 141/83 | HR 63 | Temp 98.4°F | Resp 18

## 2013-03-02 DIAGNOSIS — E3400 Carcinoid syndrome, unspecified: Secondary | ICD-10-CM

## 2013-03-02 DIAGNOSIS — D3A00 Benign carcinoid tumor of unspecified site: Secondary | ICD-10-CM

## 2013-03-02 DIAGNOSIS — C787 Secondary malignant neoplasm of liver and intrahepatic bile duct: Secondary | ICD-10-CM

## 2013-03-02 LAB — COMPREHENSIVE METABOLIC PANEL
AST: 16 U/L (ref 0–37)
Albumin: 3.7 g/dL (ref 3.5–5.2)
Alkaline Phosphatase: 148 U/L — ABNORMAL HIGH (ref 39–117)
Chloride: 105 mEq/L (ref 96–112)
Potassium: 3.7 mEq/L (ref 3.5–5.1)
Total Bilirubin: 0.3 mg/dL (ref 0.3–1.2)

## 2013-03-02 LAB — CBC WITH DIFFERENTIAL/PLATELET
Basophils Absolute: 0.1 10*3/uL (ref 0.0–0.1)
Basophils Relative: 1 % (ref 0–1)
Hemoglobin: 12.3 g/dL (ref 12.0–15.0)
Lymphocytes Relative: 24 % (ref 12–46)
MCHC: 32.7 g/dL (ref 30.0–36.0)
Monocytes Relative: 10 % (ref 3–12)
Neutro Abs: 3.4 10*3/uL (ref 1.7–7.7)
Neutrophils Relative %: 58 % (ref 43–77)
RDW: 14.7 % (ref 11.5–15.5)
WBC: 5.9 10*3/uL (ref 4.0–10.5)

## 2013-03-02 MED ORDER — OCTREOTIDE ACETATE 20 MG IM KIT
20.0000 mg | PACK | Freq: Once | INTRAMUSCULAR | Status: AC
  Administered 2013-03-02: 20 mg via INTRAMUSCULAR
  Filled 2013-03-02: qty 1

## 2013-03-02 NOTE — Progress Notes (Signed)
Tolerated Sandostatin LAR 20 mg to  Right ventrogluteal muscle, without comp[laint.

## 2013-03-15 ENCOUNTER — Encounter (HOSPITAL_BASED_OUTPATIENT_CLINIC_OR_DEPARTMENT_OTHER): Payer: Medicare HMO

## 2013-03-15 VITALS — BP 127/66 | HR 63 | Temp 98.0°F | Resp 18

## 2013-03-15 DIAGNOSIS — C787 Secondary malignant neoplasm of liver and intrahepatic bile duct: Secondary | ICD-10-CM

## 2013-03-15 DIAGNOSIS — E3400 Carcinoid syndrome, unspecified: Secondary | ICD-10-CM

## 2013-03-15 DIAGNOSIS — C7B8 Other secondary neuroendocrine tumors: Secondary | ICD-10-CM

## 2013-03-15 DIAGNOSIS — D3A00 Benign carcinoid tumor of unspecified site: Secondary | ICD-10-CM

## 2013-03-15 MED ORDER — OCTREOTIDE ACETATE 30 MG IM KIT
20.0000 mg | PACK | Freq: Once | INTRAMUSCULAR | Status: AC
  Administered 2013-03-15: 20 mg via INTRAMUSCULAR
  Filled 2013-03-15: qty 1

## 2013-03-15 NOTE — Progress Notes (Signed)
Sarah Casey presents today for injection per MD orders. Sandostatin 20 mg administered IM in left upper outer Gluteal. Administration without incident. Patient tolerated well.

## 2013-03-16 ENCOUNTER — Other Ambulatory Visit (HOSPITAL_COMMUNITY): Payer: Medicare HMO

## 2013-03-16 ENCOUNTER — Ambulatory Visit (HOSPITAL_COMMUNITY): Payer: Medicare HMO

## 2013-03-30 ENCOUNTER — Encounter (HOSPITAL_COMMUNITY): Payer: Medicare HMO | Attending: Oncology

## 2013-03-30 ENCOUNTER — Encounter (HOSPITAL_BASED_OUTPATIENT_CLINIC_OR_DEPARTMENT_OTHER): Payer: Medicare HMO

## 2013-03-30 VITALS — BP 134/59 | HR 60 | Temp 98.1°F | Resp 18

## 2013-03-30 DIAGNOSIS — D3A00 Benign carcinoid tumor of unspecified site: Secondary | ICD-10-CM | POA: Insufficient documentation

## 2013-03-30 DIAGNOSIS — E340 Carcinoid syndrome: Secondary | ICD-10-CM

## 2013-03-30 DIAGNOSIS — C787 Secondary malignant neoplasm of liver and intrahepatic bile duct: Secondary | ICD-10-CM

## 2013-03-30 DIAGNOSIS — C7A00 Malignant carcinoid tumor of unspecified site: Secondary | ICD-10-CM

## 2013-03-30 LAB — COMPREHENSIVE METABOLIC PANEL
ALT: 19 U/L (ref 0–35)
Albumin: 3.8 g/dL (ref 3.5–5.2)
Alkaline Phosphatase: 193 U/L — ABNORMAL HIGH (ref 39–117)
BUN: 17 mg/dL (ref 6–23)
Chloride: 104 mEq/L (ref 96–112)
Glucose, Bld: 128 mg/dL — ABNORMAL HIGH (ref 70–99)
Potassium: 4 mEq/L (ref 3.5–5.1)
Sodium: 142 mEq/L (ref 135–145)
Total Bilirubin: 0.4 mg/dL (ref 0.3–1.2)

## 2013-03-30 LAB — CBC WITH DIFFERENTIAL/PLATELET
Basophils Relative: 1 % (ref 0–1)
Hemoglobin: 12.4 g/dL (ref 12.0–15.0)
Lymphs Abs: 1.7 10*3/uL (ref 0.7–4.0)
Monocytes Relative: 7 % (ref 3–12)
Neutro Abs: 4.1 10*3/uL (ref 1.7–7.7)
Neutrophils Relative %: 62 % (ref 43–77)
Platelets: 344 10*3/uL (ref 150–400)
RBC: 4.39 MIL/uL (ref 3.87–5.11)

## 2013-03-30 MED ORDER — OCTREOTIDE ACETATE 20 MG IM KIT
20.0000 mg | PACK | Freq: Once | INTRAMUSCULAR | Status: AC
  Administered 2013-03-30: 20 mg via INTRAMUSCULAR
  Filled 2013-03-30: qty 1

## 2013-03-30 NOTE — Progress Notes (Signed)
Sarah Casey presents today for injection per MD orders. sandostatin 20 mg administered IM in right Gluteal. Administration without incident. Patient tolerated well.

## 2013-03-30 NOTE — Progress Notes (Signed)
Labs drawn today for cbc/diff,cmp,serotonin

## 2013-04-05 LAB — SEROTONIN SERUM: Serotonin, Serum: 2070 ng/mL — ABNORMAL HIGH (ref 56–244)

## 2013-04-13 ENCOUNTER — Encounter (HOSPITAL_BASED_OUTPATIENT_CLINIC_OR_DEPARTMENT_OTHER): Payer: Medicare HMO

## 2013-04-13 ENCOUNTER — Other Ambulatory Visit (HOSPITAL_COMMUNITY): Payer: Medicare HMO

## 2013-04-13 VITALS — BP 136/66 | HR 63 | Temp 98.1°F | Resp 18

## 2013-04-13 DIAGNOSIS — D3A00 Benign carcinoid tumor of unspecified site: Secondary | ICD-10-CM

## 2013-04-13 DIAGNOSIS — E340 Carcinoid syndrome: Secondary | ICD-10-CM

## 2013-04-13 MED ORDER — OCTREOTIDE ACETATE 30 MG IM KIT
20.0000 mg | PACK | Freq: Once | INTRAMUSCULAR | Status: AC
  Administered 2013-04-13: 20 mg via INTRAMUSCULAR
  Filled 2013-04-13: qty 1

## 2013-04-13 NOTE — Progress Notes (Signed)
Sarah Casey presents today for injection per MD orders. Sandostatin 20 mg administered IM in left upper outer quadrant Gluteal. Administration without incident. Patient tolerated well.

## 2013-04-16 ENCOUNTER — Ambulatory Visit (INDEPENDENT_AMBULATORY_CARE_PROVIDER_SITE_OTHER): Payer: Medicare HMO | Admitting: Family Medicine

## 2013-04-16 ENCOUNTER — Encounter: Payer: Self-pay | Admitting: Family Medicine

## 2013-04-16 VITALS — BP 142/78 | Ht 63.0 in | Wt 169.4 lb

## 2013-04-16 DIAGNOSIS — K219 Gastro-esophageal reflux disease without esophagitis: Secondary | ICD-10-CM

## 2013-04-16 DIAGNOSIS — J309 Allergic rhinitis, unspecified: Secondary | ICD-10-CM

## 2013-04-16 DIAGNOSIS — D3A00 Benign carcinoid tumor of unspecified site: Secondary | ICD-10-CM

## 2013-04-16 MED ORDER — AMLODIPINE BESYLATE 5 MG PO TABS: 5.0000 mg | ORAL_TABLET | Freq: Every day | ORAL | Status: DC

## 2013-04-16 MED ORDER — AMOXICILLIN 500 MG PO CAPS: 500.0000 mg | ORAL_CAPSULE | Freq: Three times a day (TID) | ORAL | Status: DC

## 2013-04-16 MED ORDER — RANITIDINE HCL 300 MG PO CAPS: 300.0000 mg | ORAL_CAPSULE | Freq: Two times a day (BID) | ORAL | Status: DC

## 2013-04-16 NOTE — Progress Notes (Signed)
  Subjective:    Patient ID: Sarah Casey, female    DOB: [birth year], 72 y.o.   MRN: [id number]  HPI  Patient arrives for a follow up on blood pressure and meds. Haven't taken blood pressure meds in last 2 weeks and needs new rx.trying to watch salt intake.  Walking some,trying to watch salt diet  Patient reports the carcinoid tumor has worsened. Followed by specialist  Reflux heartburn stable, some difficulty swallowing bread, feels a little stuck.  Allergies acting up, runny nose and scratch throat and cough. Cough is now productive. No fever. Phlegm is somewhat yellowish. Long-standing history of smoking.  Review of Systems No headache no chest pain no back pain no significant abdominal pain ROS otherwise negative    Objective:   Physical Exam  Blood pressure 144/78 on repeat. HEENT moderate nasal congestion. Pharynx normal lungs clear bronchial cough during exam heart regular in rhythm ankles without edema.      Assessment & Plan:  Impression 1 hypertension suboptimal in with recent no medication discussed #2 allergic rhinitis some flare #3 acute bronchitis discussed #4 reflux somewhat worse

## 2013-04-27 ENCOUNTER — Encounter (HOSPITAL_BASED_OUTPATIENT_CLINIC_OR_DEPARTMENT_OTHER): Payer: Medicare HMO

## 2013-04-27 VITALS — BP 137/76 | HR 67 | Temp 97.6°F | Resp 18

## 2013-04-27 DIAGNOSIS — C7A00 Malignant carcinoid tumor of unspecified site: Secondary | ICD-10-CM

## 2013-04-27 DIAGNOSIS — D3A00 Benign carcinoid tumor of unspecified site: Secondary | ICD-10-CM

## 2013-04-27 DIAGNOSIS — E340 Carcinoid syndrome: Secondary | ICD-10-CM

## 2013-04-27 DIAGNOSIS — C787 Secondary malignant neoplasm of liver and intrahepatic bile duct: Secondary | ICD-10-CM

## 2013-04-27 LAB — CBC WITH DIFFERENTIAL/PLATELET
Basophils Absolute: 0.1 10*3/uL (ref 0.0–0.1)
Basophils Relative: 1 % (ref 0–1)
Eosinophils Absolute: 0.3 10*3/uL (ref 0.0–0.7)
Eosinophils Relative: 5 % (ref 0–5)
HCT: 36.9 % (ref 36.0–46.0)
MCH: 28.2 pg (ref 26.0–34.0)
MCHC: 32.5 g/dL (ref 30.0–36.0)
Monocytes Absolute: 0.5 10*3/uL (ref 0.1–1.0)
Monocytes Relative: 8 % (ref 3–12)
Neutro Abs: 3.6 10*3/uL (ref 1.7–7.7)
RDW: 15.2 % (ref 11.5–15.5)

## 2013-04-27 LAB — COMPREHENSIVE METABOLIC PANEL
ALT: 15 U/L (ref 0–35)
AST: 22 U/L (ref 0–37)
Albumin: 3.8 g/dL (ref 3.5–5.2)
CO2: 30 mEq/L (ref 19–32)
Calcium: 9.4 mg/dL (ref 8.4–10.5)
Creatinine, Ser: 0.89 mg/dL (ref 0.50–1.10)
GFR calc non Af Amer: 63 mL/min — ABNORMAL LOW (ref >90)
Potassium: 3.9 mEq/L (ref 3.5–5.1)
Total Bilirubin: 0.3 mg/dL (ref 0.3–1.2)

## 2013-04-27 MED ORDER — OCTREOTIDE ACETATE 30 MG IM KIT
20.0000 mg | PACK | Freq: Once | INTRAMUSCULAR | Status: AC
  Administered 2013-04-27: 20 mg via INTRAMUSCULAR
  Filled 2013-04-27: qty 1

## 2013-04-27 NOTE — Progress Notes (Signed)
Labs drawn today for cbc/diff,cmp,serotonin 

## 2013-04-27 NOTE — Progress Notes (Signed)
Sarah Casey presents today for injection per MD orders. Sandostatin 20 mg  administered IM in left Gluteal. Administration without incident. Patient tolerated well.

## 2013-05-10 NOTE — Progress Notes (Signed)
Sarah Asa, MD [address] [place] [address]  Metastatic recurrent carcinoid - Plan: Chromogranin A, octreotide (SANDOSTATIN LAR) IM injection 20 mg  Weight loss - Plan: megestrol (MEGACE) 400 MG/10ML suspension  Decreased appetite - Plan: megestrol (MEGACE) 400 MG/10ML suspension  CURRENT THERAPY:Octreotide 20 mg every 2 weeks   INTERVAL HISTORY: Sarah Casey 72 y.o. 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 She continues to display progression of disease by CT scan criteria.  Symptomatically, she is doing better on 20 mg of Octreotide every 14 days (compared to 40 mg every 4 weeks).  I spent time reviewing NCCN guidelines and recommendations for treatment with progression of disease. Octreotide still remains the standard of care. Cytotoxic drugs and everolimus remain category 3.  I personally reviewed and went over laboratory results with the patient.  Labs from 04/27/2013 shows a WBC of 6.0, Hgb 12.0, and platelet count of 326,000.  Metabolic panel is unimpressive except for decreased GFR and elevated glucose and Alk phos.  All of which are stable.  Serum serotonin is stable at 1911.  She shows me a rash on B/L LE that began about 1-2 weeks ago.  It is pruritic.  She recently was started on Amoxicillin and just finished a few days ago.  I suspect this is a reaction to that, because she denies any changes in detergent, soaps, etc.  I have asked her to use Benadryl for symptomatic management.   She reports that her BMs are decreased to 4 daily with Imodium now that she is taking 20 mg of Octreotide every 14 days (compared to 40 mg monthly).  We will continue with this regimen.  She notes that her appetite is non-existent.  She has lost Lbs over the past year.  She weighs 169 lbs today and weighed 186 lbs last year in November.  She  reports that she barely eats every day and she forces foods daily because "I know I have to eat."  As a result of this weight loss and her decreased appetite, I will try Megace 800 mg daily.  She was educated about its terrible taste and she needs to try to take if for 2-3 weeks to see if it is going to work for her.  She can defer a refill in 1 month if not effective.   She otherwise denies any complaints and oncologic ROS questioning is negative.    Past Medical History  Diagnosis Date  . Fracture of ankle     right  . Hypertension   . Ulcer   . 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 She has a very elevated serum serotonin level to over 1600 and she has an elevated 5-HIAA level in her urine.   . PUD (peptic ulcer disease)     has Metastatic recurrent carcinoid; Essential hypertension, benign; Esophageal reflux; and Allergic rhinitis on her problem list.     is allergic to lodine and vasotec.  Ms. Better had no medications administered during this visit.  Past Surgical History  Procedure Laterality Date  . Dilation and curettage of uterus    . Cholecystectomy    . Hernia repair      umbilical  . Ankle debridement  right for staff infection    Denies any headaches, dizziness, double vision, fevers, chills, night sweats, nausea, vomiting, constipation, chest pain, heart palpitations, shortness of breath, blood in stool, black tarry stool, urinary pain, urinary burning, urinary frequency, hematuria.   PHYSICAL EXAMINATION  ECOG PERFORMANCE STATUS: 1 - Symptomatic but completely ambulatory  There were no vitals filed for this visit.  GENERAL:alert, no distress, well nourished, well developed, comfortable, cooperative, obese and smiling SKIN: skin color, texture, turgor are normal, no rashes or significant  lesions HEAD: Normocephalic, No masses, lesions, tenderness or abnormalities EYES: normal, PERRLA, EOMI, Conjunctiva are pink and non-injected EARS: External ears normal OROPHARYNX:mucous membranes are moist  NECK: supple, trachea midline LYMPH:  no palpable lymphadenopathy BREAST:not examined LUNGS: clear to auscultation  HEART: regular rate & rhythm, no murmurs, no gallops, S1 normal and S2 normal ABDOMEN:abdomen soft, non-tender, obese, normal bowel sounds and open lesion is healing nicely in umbilical area. BACK: Back symmetric, no curvature. EXTREMITIES:less then 2 second capillary refill, no joint deformities, effusion, or inflammation, no edema, no clubbing, no cyanosis, positive findings:  B/L LE erythematous rash starting at patella and more concentrated inferiorly, particularly near ankle R>>>L with no clear pattern of rash.  NEURO: alert & oriented x 3 with fluent speech, no focal motor/sensory deficits, gait normal    LABORATORY DATA: CBC    Component Value Date/Time   WBC 6.0 04/27/2013 1203   RBC 4.25 04/27/2013 1203   HGB 12.0 04/27/2013 1203   HCT 36.9 04/27/2013 1203   PLT 326 04/27/2013 1203   MCV 86.8 04/27/2013 1203   MCH 28.2 04/27/2013 1203   MCHC 32.5 04/27/2013 1203   RDW 15.2 04/27/2013 1203   LYMPHSABS 1.6 04/27/2013 1203   MONOABS 0.5 04/27/2013 1203   EOSABS 0.3 04/27/2013 1203   BASOSABS 0.1 04/27/2013 1203      Chemistry      Component Value Date/Time   NA 142 04/27/2013 1203   K 3.9 04/27/2013 1203   CL 102 04/27/2013 1203   CO2 30 04/27/2013 1203   BUN 19 04/27/2013 1203   CREATININE 0.89 04/27/2013 1203      Component Value Date/Time   CALCIUM 9.4 04/27/2013 1203   ALKPHOS 152* 04/27/2013 1203   AST 22 04/27/2013 1203   ALT 15 04/27/2013 1203   BILITOT 0.3 04/27/2013 1203      Results for SAORI, UMHOLTZ (MRN [id number]) as of 05/10/2013 16:33  Ref. Range 04/27/2013 12:03  Serotonin, Serum Latest Range: 56-244 ng/mL 1911 (H)       ASSESSMENT:  1. 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 She continues to display progression of disease by CT scan criteria. Symptomatically, she is doing better on 20 mg of Octreotide every 14 days (compared to 40 mg every 4 weeks). 2. Diarrhea, secondary to #1.  Improved on 20 mg every 14 days.  3. B/L LE rash inferior to patella, R >>> L.  Recently finished amoxicillin and this is the only identifiable change. 4. Umbilical wound, healing nicely with supportive care with Neosporin. 5. Decreased appetite with weight loss of nearly 20 lbs x 1 year. 6. Weight loss. 7. ?Amoxicillin-induced rash on legs?  Patient Active Problem List   Diagnosis Date Noted  . Essential hypertension, benign 10/15/2012  . Esophageal reflux 10/15/2012  . Allergic rhinitis 10/15/2012  . 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 I personally reviewed and went over laboratory results  with the patient. 2. Influenza vaccine, declined today. 3. CT abd/pelvis in [id number] as scheduled.  4. Labs every 4 weeks: CBC diff, CMET, Serum serotonin, and Chromogranin A 5. Continue Octreotide as scheduled, .  6. Rx for Megace 400 mg BID # 600 mL with 1 refill.  7. Continue Benadryl for pruritic rash on legs.  8. NCCN guidelines reviewed 9. Return in 3 months for follow-up   THERAPY PLAN:  Symptomatically, the patient is doing better with the change in Octreotide 20 mg every 14 days (compared to 40 mg monthly).  Therefore, we will continue with this regimen.  I suspect she may have an allergic reaction to Amoxicillin that is localized to B/L legs.  No rash on abdomen or chest.  She will treat symptomatically with Benadryl.  All questions were answered. The patient knows to call the clinic with any problems, questions or concerns. We can certainly see the patient much sooner if necessary.  Patient and plan  discussed with Dr. Annamarie Dawley and she is in agreement with the aforementioned.   KEFALAS,THOMAS

## 2013-05-11 ENCOUNTER — Encounter (HOSPITAL_COMMUNITY): Payer: Self-pay | Admitting: Oncology

## 2013-05-11 ENCOUNTER — Encounter (HOSPITAL_BASED_OUTPATIENT_CLINIC_OR_DEPARTMENT_OTHER): Payer: Medicare HMO | Admitting: Oncology

## 2013-05-11 ENCOUNTER — Encounter (HOSPITAL_COMMUNITY): Payer: Medicare HMO | Attending: Oncology

## 2013-05-11 DIAGNOSIS — E340 Carcinoid syndrome: Secondary | ICD-10-CM

## 2013-05-11 DIAGNOSIS — D3A00 Benign carcinoid tumor of unspecified site: Secondary | ICD-10-CM | POA: Insufficient documentation

## 2013-05-11 DIAGNOSIS — R634 Abnormal weight loss: Secondary | ICD-10-CM

## 2013-05-11 DIAGNOSIS — C787 Secondary malignant neoplasm of liver and intrahepatic bile duct: Secondary | ICD-10-CM

## 2013-05-11 DIAGNOSIS — R630 Anorexia: Secondary | ICD-10-CM

## 2013-05-11 DIAGNOSIS — C7A00 Malignant carcinoid tumor of unspecified site: Secondary | ICD-10-CM

## 2013-05-11 DIAGNOSIS — C7B8 Other secondary neuroendocrine tumors: Secondary | ICD-10-CM

## 2013-05-11 MED ORDER — MEGESTROL ACETATE 400 MG/10ML PO SUSP: 400.0000 mg | Freq: Two times a day (BID) | ORAL | Status: DC

## 2013-05-11 MED ORDER — OCTREOTIDE ACETATE 30 MG IM KIT
20.0000 mg | PACK | Freq: Once | INTRAMUSCULAR | Status: AC
  Administered 2013-05-11: 20 mg via INTRAMUSCULAR
  Filled 2013-05-11: qty 1

## 2013-05-11 NOTE — Progress Notes (Signed)
Sarah Casey presents today for injection per MD orders. Sandostatin LAR 20 mg administered IM z-track in left Gluteal. Administration without incident. Patient tolerated well.

## 2013-05-11 NOTE — Patient Instructions (Signed)
[facility] Discharge Instructions  RECOMMENDATIONS MADE BY THE CONSULTANT AND ANY TEST RESULTS WILL BE SENT TO YOUR REFERRING PHYSICIAN.  EXAM FINDINGS BY THE PHYSICIAN TODAY AND SIGNS OR SYMPTOMS TO REPORT TO CLINIC OR PRIMARY PHYSICIAN: Exam and findings as discussed by Dellis Anes, PA-C.  Will continue sandostatin every 2 weeks, blood work every 4 weeks and follow-up after scans in February.  Sandostatin injection today.  MEDICATIONS PRESCRIBED:  Megace 10 ml twice daily  INSTRUCTIONS/FOLLOW-UP: Injections every 2 weeks, blood work every 4 weeks, scans in February and follow-up after scans.  Thank you for choosing [facility] to provide your oncology and hematology care.  To afford each patient quality time with our providers, please arrive at least 15 minutes before your scheduled appointment time.  With your help, our goal is to use those 15 minutes to complete the necessary work-up to ensure our physicians have the information they need to help with your evaluation and healthcare recommendations.    Effective January 1st, 2014, we ask that you re-schedule your appointment with our physicians should you arrive 10 or more minutes late for your appointment.  We strive to give you quality time with our providers, and arriving late affects you and other patients whose appointments are after yours.    Again, thank you for choosing [facility].  Our hope is that these requests will decrease the amount of time that you wait before being seen by our physicians.       _____________________________________________________________  Should you have questions after your visit to [facility], please contact our office at [phone] between the hours of 8:30 a.m. and 5:00 p.m.  Voicemails left after 4:30 p.m. will not be returned until the following business day.  For prescription refill requests, have your pharmacy contact our office  with your prescription refill request.

## 2013-05-28 ENCOUNTER — Encounter (HOSPITAL_COMMUNITY): Payer: Medicare HMO

## 2013-05-28 ENCOUNTER — Encounter (HOSPITAL_COMMUNITY): Payer: Medicare HMO | Attending: Oncology

## 2013-05-28 VITALS — BP 142/76 | HR 64

## 2013-05-28 DIAGNOSIS — C7A00 Malignant carcinoid tumor of unspecified site: Secondary | ICD-10-CM

## 2013-05-28 DIAGNOSIS — C787 Secondary malignant neoplasm of liver and intrahepatic bile duct: Secondary | ICD-10-CM

## 2013-05-28 DIAGNOSIS — D3A00 Benign carcinoid tumor of unspecified site: Secondary | ICD-10-CM

## 2013-05-28 LAB — COMPREHENSIVE METABOLIC PANEL WITH GFR
ALT: 15 U/L (ref 0–35)
AST: 20 U/L (ref 0–37)
Albumin: 3.9 g/dL (ref 3.5–5.2)
Alkaline Phosphatase: 181 U/L — ABNORMAL HIGH (ref 39–117)
BUN: 25 mg/dL — ABNORMAL HIGH (ref 6–23)
CO2: 26 meq/L (ref 19–32)
Calcium: 9.3 mg/dL (ref 8.4–10.5)
Chloride: 105 meq/L (ref 96–112)
Creatinine, Ser: 0.87 mg/dL (ref 0.50–1.10)
GFR calc Af Amer: 75 mL/min — ABNORMAL LOW
GFR calc non Af Amer: 65 mL/min — ABNORMAL LOW
Glucose, Bld: 107 mg/dL — ABNORMAL HIGH (ref 70–99)
Potassium: 3.3 meq/L — ABNORMAL LOW (ref 3.5–5.1)
Sodium: 142 meq/L (ref 135–145)
Total Bilirubin: 0.3 mg/dL (ref 0.3–1.2)
Total Protein: 7.3 g/dL (ref 6.0–8.3)

## 2013-05-28 LAB — CBC WITH DIFFERENTIAL/PLATELET
Basophils Absolute: 0.1 10*3/uL (ref 0.0–0.1)
Eosinophils Absolute: 0.3 10*3/uL (ref 0.0–0.7)
Eosinophils Relative: 5 % (ref 0–5)
HCT: 37.5 % (ref 36.0–46.0)
Lymphocytes Relative: 29 % (ref 12–46)
Lymphs Abs: 1.6 10*3/uL (ref 0.7–4.0)
MCHC: 33.3 g/dL (ref 30.0–36.0)
MCV: 87.6 fL (ref 78.0–100.0)
Monocytes Absolute: 0.5 10*3/uL (ref 0.1–1.0)
Neutrophils Relative %: 56 % (ref 43–77)
Platelets: 351 10*3/uL (ref 150–400)
RBC: 4.28 MIL/uL (ref 3.87–5.11)
WBC: 5.6 10*3/uL (ref 4.0–10.5)

## 2013-05-28 MED ORDER — OCTREOTIDE ACETATE 30 MG IM KIT
20.0000 mg | PACK | Freq: Once | INTRAMUSCULAR | Status: AC
  Administered 2013-05-28: 20 mg via INTRAMUSCULAR
  Filled 2013-05-28: qty 1

## 2013-05-28 NOTE — Progress Notes (Signed)
Sarah Casey's reason for visit today is for an injection and labs as scheduled per MD orders.  Labs were drawn prior to administration of ordered medication.  Venipuncture performed with a 23 gauge butterfly needle to R Antecubital.  Sarah Casey also received Sandostatin 20mg  IM per MD orders; see [facility] for administration details.  Sarah Casey tolerated all procedures well and without incident; questions were answered and patient was discharged.

## 2013-06-04 LAB — CHROMOGRANIN A: Chromogranin A: 334 ng/mL — ABNORMAL HIGH (ref 1.9–15.0)

## 2013-06-11 ENCOUNTER — Encounter (HOSPITAL_BASED_OUTPATIENT_CLINIC_OR_DEPARTMENT_OTHER): Payer: Medicare HMO

## 2013-06-11 VITALS — BP 142/72 | HR 70 | Temp 97.7°F | Resp 20 | Wt 164.4 lb

## 2013-06-11 DIAGNOSIS — C7A00 Malignant carcinoid tumor of unspecified site: Secondary | ICD-10-CM

## 2013-06-11 DIAGNOSIS — D3A00 Benign carcinoid tumor of unspecified site: Secondary | ICD-10-CM

## 2013-06-11 MED ORDER — OCTREOTIDE ACETATE 30 MG IM KIT
20.0000 mg | PACK | Freq: Once | INTRAMUSCULAR | Status: AC
  Administered 2013-06-11: 20 mg via INTRAMUSCULAR
  Filled 2013-06-11: qty 1

## 2013-06-11 NOTE — Progress Notes (Signed)
Sarah Casey presents today for injection per MD orders. sandostatin 20 mg  administered IM in left Gluteal. Administration without incident. Patient tolerated well.

## 2013-06-25 ENCOUNTER — Encounter (HOSPITAL_BASED_OUTPATIENT_CLINIC_OR_DEPARTMENT_OTHER): Payer: Medicare HMO

## 2013-06-25 ENCOUNTER — Ambulatory Visit (INDEPENDENT_AMBULATORY_CARE_PROVIDER_SITE_OTHER): Payer: Medicare HMO | Admitting: Family Medicine

## 2013-06-25 ENCOUNTER — Encounter: Payer: Self-pay | Admitting: Family Medicine

## 2013-06-25 VITALS — BP 142/78 | HR 73 | Temp 97.2°F | Resp 18

## 2013-06-25 VITALS — BP 138/70 | Temp 98.2°F | Ht 63.5 in | Wt 165.0 lb

## 2013-06-25 DIAGNOSIS — K219 Gastro-esophageal reflux disease without esophagitis: Secondary | ICD-10-CM

## 2013-06-25 DIAGNOSIS — D3A00 Benign carcinoid tumor of unspecified site: Secondary | ICD-10-CM

## 2013-06-25 DIAGNOSIS — I10 Essential (primary) hypertension: Secondary | ICD-10-CM

## 2013-06-25 DIAGNOSIS — E340 Carcinoid syndrome: Secondary | ICD-10-CM

## 2013-06-25 DIAGNOSIS — C7A00 Malignant carcinoid tumor of unspecified site: Secondary | ICD-10-CM

## 2013-06-25 DIAGNOSIS — L0291 Cutaneous abscess, unspecified: Secondary | ICD-10-CM

## 2013-06-25 DIAGNOSIS — R197 Diarrhea, unspecified: Secondary | ICD-10-CM

## 2013-06-25 DIAGNOSIS — C787 Secondary malignant neoplasm of liver and intrahepatic bile duct: Secondary | ICD-10-CM

## 2013-06-25 DIAGNOSIS — R059 Cough, unspecified: Secondary | ICD-10-CM

## 2013-06-25 DIAGNOSIS — R05 Cough: Secondary | ICD-10-CM

## 2013-06-25 DIAGNOSIS — J309 Allergic rhinitis, unspecified: Secondary | ICD-10-CM

## 2013-06-25 LAB — CBC WITH DIFFERENTIAL/PLATELET
Basophils Absolute: 0 10*3/uL (ref 0.0–0.1)
Basophils Relative: 1 % (ref 0–1)
Eosinophils Absolute: 0.2 10*3/uL (ref 0.0–0.7)
Eosinophils Relative: 4 % (ref 0–5)
Hemoglobin: 12.5 g/dL (ref 12.0–15.0)
MCH: 28.5 pg (ref 26.0–34.0)
MCHC: 32.6 g/dL (ref 30.0–36.0)
MCV: 87.5 fL (ref 78.0–100.0)
Neutrophils Relative %: 60 % (ref 43–77)
Platelets: 330 10*3/uL (ref 150–400)
RDW: 14.5 % (ref 11.5–15.5)

## 2013-06-25 LAB — COMPREHENSIVE METABOLIC PANEL
ALT: 14 U/L (ref 0–35)
AST: 19 U/L (ref 0–37)
Albumin: 3.9 g/dL (ref 3.5–5.2)
Alkaline Phosphatase: 167 U/L — ABNORMAL HIGH (ref 39–117)
Calcium: 9.3 mg/dL (ref 8.4–10.5)
GFR calc Af Amer: 68 mL/min — ABNORMAL LOW (ref >90)
Potassium: 4 mEq/L (ref 3.5–5.1)
Sodium: 143 mEq/L (ref 135–145)
Total Protein: 7.3 g/dL (ref 6.0–8.3)

## 2013-06-25 MED ORDER — DOXYCYCLINE HYCLATE 100 MG PO TABS: 100.0000 mg | ORAL_TABLET | Freq: Two times a day (BID) | ORAL | Status: DC

## 2013-06-25 MED ORDER — OCTREOTIDE ACETATE 30 MG IM KIT
20.0000 mg | PACK | Freq: Once | INTRAMUSCULAR | Status: AC
  Administered 2013-06-25: 20 mg via INTRAMUSCULAR
  Filled 2013-06-25: qty 1

## 2013-06-25 NOTE — Progress Notes (Signed)
   Subjective:    Patient ID: Sarah Casey, female    DOB: 1940-12-12, 72 y.o.   MRN: [id number]  Cough This is a recurrent problem. The current episode started more than 1 month ago. Associated symptoms include myalgias. Associated symptoms comments: Diarrhea, abdominal pain. Treatments tried: tylenol.    Weight loss, choking on food for the last 1.5 months. Feels stuck at times. Reflux worsening  Patient has had intermittent cough for the past month. Slightly productive at times. No hemoptysis. No chest pain.  Patient claims compliance with blood pressure medication. No obvious side effects. Trying to watch his salt intake.   Drainage from belly button for the past 3 months. Slight irritation and drainage  Currently under treatment for metastatic carcinoid tumor   Review of Systems  Respiratory: Positive for cough.   Musculoskeletal: Positive for myalgias.   ROS otherwise negative     Objective:   Physical Exam Alert no acute distress. H&T normal. Lungs clear. Heart regular in rhythm. Abdomen benign. Umbilical region irritated slightly inflamed sore within the umbilicus region      Assessment & Plan:  Impression 1 progressive reflux with element of dysphagia discussed #2 hypertension decent control. #3 carcinoid tumor. #4 skin infection plan Doxy twice a day. Chest x-ray. GI consult. Maintain other medications. Diet exercise discussed. Check every 6 months. WSL

## 2013-06-25 NOTE — Progress Notes (Signed)
Sandostantin 20 mg given IM z-track to left buttocks.  Tolerated injection well.  Has appt for next injection.

## 2013-06-25 NOTE — Progress Notes (Signed)
Labs drawn today for cbc/diff,cmp,chromogranin A 

## 2013-06-26 ENCOUNTER — Ambulatory Visit (HOSPITAL_COMMUNITY)
Admission: RE | Admit: 2013-06-26 | Discharge: 2013-06-26 | Disposition: A | Discharge status: Home / Self Care | Payer: Medicare HMO | Source: Ambulatory Visit | Attending: Family Medicine | Admitting: Family Medicine

## 2013-06-26 DIAGNOSIS — R05 Cough: Secondary | ICD-10-CM | POA: Insufficient documentation

## 2013-06-26 DIAGNOSIS — R059 Cough, unspecified: Secondary | ICD-10-CM | POA: Insufficient documentation

## 2013-06-26 DIAGNOSIS — I10 Essential (primary) hypertension: Secondary | ICD-10-CM | POA: Insufficient documentation

## 2013-06-28 LAB — WOUND CULTURE
Gram Stain: NONE SEEN
Gram Stain: NONE SEEN

## 2013-07-02 ENCOUNTER — Encounter: Payer: Self-pay | Admitting: Internal Medicine

## 2013-07-02 NOTE — Progress Notes (Signed)
Patient notified and verbalized understanding of results 

## 2013-07-03 LAB — CHROMOGRANIN A: Chromogranin A: 460 ng/mL — ABNORMAL HIGH (ref 1.9–15.0)

## 2013-07-09 ENCOUNTER — Encounter (HOSPITAL_COMMUNITY): Payer: Medicare HMO | Attending: Oncology

## 2013-07-09 VITALS — BP 130/71 | HR 80 | Resp 20

## 2013-07-09 DIAGNOSIS — D3A00 Benign carcinoid tumor of unspecified site: Secondary | ICD-10-CM | POA: Insufficient documentation

## 2013-07-09 DIAGNOSIS — C7A00 Malignant carcinoid tumor of unspecified site: Secondary | ICD-10-CM

## 2013-07-09 MED ORDER — OCTREOTIDE ACETATE 30 MG IM KIT
20.0000 mg | PACK | Freq: Once | INTRAMUSCULAR | Status: AC
  Administered 2013-07-09: 20 mg via INTRAMUSCULAR
  Filled 2013-07-09: qty 1

## 2013-07-09 NOTE — Progress Notes (Signed)
Sarah Casey presents today for injection per MD orders. Sandastatin 20mg  administered IM in left gluteal. Administration without incident. Patient tolerated well.

## 2013-07-23 ENCOUNTER — Encounter (HOSPITAL_BASED_OUTPATIENT_CLINIC_OR_DEPARTMENT_OTHER): Payer: Medicare HMO

## 2013-07-23 VITALS — BP 132/81 | HR 76 | Temp 97.9°F | Resp 20 | Wt 159.5 lb

## 2013-07-23 DIAGNOSIS — C787 Secondary malignant neoplasm of liver and intrahepatic bile duct: Secondary | ICD-10-CM

## 2013-07-23 DIAGNOSIS — E340 Carcinoid syndrome: Secondary | ICD-10-CM

## 2013-07-23 DIAGNOSIS — C7A00 Malignant carcinoid tumor of unspecified site: Secondary | ICD-10-CM

## 2013-07-23 DIAGNOSIS — D3A00 Benign carcinoid tumor of unspecified site: Secondary | ICD-10-CM

## 2013-07-23 LAB — CBC WITH DIFFERENTIAL/PLATELET
BASOS ABS: 0 10*3/uL (ref 0.0–0.1)
Basophils Relative: 1 % (ref 0–1)
Eosinophils Absolute: 0.2 10*3/uL (ref 0.0–0.7)
Eosinophils Relative: 3 % (ref 0–5)
HCT: 39.6 % (ref 36.0–46.0)
Hemoglobin: 13.1 g/dL (ref 12.0–15.0)
LYMPHS PCT: 21 % (ref 12–46)
Lymphs Abs: 1.2 10*3/uL (ref 0.7–4.0)
MCH: 28.8 pg (ref 26.0–34.0)
MCHC: 33.1 g/dL (ref 30.0–36.0)
MCV: 87 fL (ref 78.0–100.0)
Monocytes Absolute: 0.4 10*3/uL (ref 0.1–1.0)
Monocytes Relative: 7 % (ref 3–12)
NEUTROS ABS: 4 10*3/uL (ref 1.7–7.7)
Neutrophils Relative %: 68 % (ref 43–77)
PLATELETS: 398 10*3/uL (ref 150–400)
RBC: 4.55 MIL/uL (ref 3.87–5.11)
RDW: 15 % (ref 11.5–15.5)
WBC: 5.8 10*3/uL (ref 4.0–10.5)

## 2013-07-23 LAB — COMPREHENSIVE METABOLIC PANEL
ALBUMIN: 3.6 g/dL (ref 3.5–5.2)
ALK PHOS: 174 U/L — AB (ref 39–117)
ALT: 16 U/L (ref 0–35)
AST: 20 U/L (ref 0–37)
BILIRUBIN TOTAL: 0.3 mg/dL (ref 0.3–1.2)
BUN: 21 mg/dL (ref 6–23)
CHLORIDE: 104 meq/L (ref 96–112)
CO2: 25 mEq/L (ref 19–32)
Calcium: 9.4 mg/dL (ref 8.4–10.5)
Creatinine, Ser: 0.98 mg/dL (ref 0.50–1.10)
GFR calc Af Amer: 65 mL/min — ABNORMAL LOW (ref >90)
GFR calc non Af Amer: 56 mL/min — ABNORMAL LOW (ref >90)
Glucose, Bld: 100 mg/dL — ABNORMAL HIGH (ref 70–99)
POTASSIUM: 3.9 meq/L (ref 3.7–5.3)
Sodium: 143 mEq/L (ref 137–147)
Total Protein: 7.4 g/dL (ref 6.0–8.3)

## 2013-07-23 MED ORDER — OCTREOTIDE ACETATE 30 MG IM KIT
20.0000 mg | PACK | Freq: Once | INTRAMUSCULAR | Status: AC
  Administered 2013-07-23: 20 mg via INTRAMUSCULAR
  Filled 2013-07-23: qty 1

## 2013-07-23 NOTE — Progress Notes (Signed)
Sarah Casey presents today for injection per MD orders. Sandostatin 20 mg administered IM in left gluteal. Administration without incident. Patient tolerated well.

## 2013-07-23 NOTE — Progress Notes (Signed)
Labs drawn today for chromogranin A,cbc/diff,cmp,serotonin

## 2013-07-26 ENCOUNTER — Other Ambulatory Visit: Payer: Self-pay | Admitting: Internal Medicine

## 2013-07-26 ENCOUNTER — Encounter (INDEPENDENT_AMBULATORY_CARE_PROVIDER_SITE_OTHER): Payer: Self-pay

## 2013-07-26 ENCOUNTER — Ambulatory Visit (INDEPENDENT_AMBULATORY_CARE_PROVIDER_SITE_OTHER): Payer: Medicare HMO | Admitting: Gastroenterology

## 2013-07-26 ENCOUNTER — Encounter: Payer: Self-pay | Admitting: Gastroenterology

## 2013-07-26 VITALS — BP 134/78 | HR 76 | Temp 97.6°F | Wt 159.8 lb

## 2013-07-26 DIAGNOSIS — R1310 Dysphagia, unspecified: Secondary | ICD-10-CM

## 2013-07-26 DIAGNOSIS — R1319 Other dysphagia: Secondary | ICD-10-CM

## 2013-07-26 DIAGNOSIS — K219 Gastro-esophageal reflux disease without esophagitis: Secondary | ICD-10-CM

## 2013-07-26 DIAGNOSIS — D3A00 Benign carcinoid tumor of unspecified site: Secondary | ICD-10-CM

## 2013-07-26 DIAGNOSIS — R1314 Dysphagia, pharyngoesophageal phase: Secondary | ICD-10-CM

## 2013-07-26 NOTE — Patient Instructions (Signed)
1. Upper endoscopy as scheduled. See separate instructions.  

## 2013-07-26 NOTE — Assessment & Plan Note (Addendum)
Increased reflux type symptoms and esophageal dysphagia, anorexia, weight loss. Currently on H2 blocker. Discussed options including barium esophagram vs EGD and she desires EGD. Plan for EGD/ED with Dr. Gala Romney.  I have discussed the risks, alternatives, benefits with regards to but not limited to the risk of reaction to medication, bleeding, infection, perforation and the patient is agreeable to proceed. Written consent to be obtained.

## 2013-07-26 NOTE — Assessment & Plan Note (Addendum)
Followed by oncology. Most likely cause of chronic diarrhea although could not exclude more common etiology such as microscopic colitis, IBS, superimposed infectious etiology. Currently patient has been doing better with regards to diarrhea therefore would simply monitor for now.

## 2013-07-26 NOTE — Progress Notes (Signed)
Primary Care Physician: Rubbie Battiest, MD  Primary Gastroenterologist:  Garfield Cornea, MD   Chief Complaint  Patient presents with  . Diarrhea  . Dysphagia    HPI: Sarah Casey is a 73 y.o. female here for further evaluation of dysphagia. 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 Primary carcinoid was in the small bowel. Originally diagnosed in 2005 but noted have a recurrence in 2013. She is on octreotide 20 mg every 2 weeks. She has had progression of disease by CT scan criteria her. Last CT in May of 2014. She is followed by oncology at [facility]. She weighed 185 pounds in February 2014. In November 2014 she weighed 169 pounds. She is down an additional 10 pounds today. Due for repeat CT abdomen and pelvis next month.  Saw Dr. Wolfgang Phoenix back in December complains of worsening reflux and dysphagia. Difficult swallowing. Every thing has to be very soft. Gets choked a lot. Food and some beverages feel like sticking in the upper esophagus. Lots of belching. Will belch up food. No vomiting or heartburn. Some indigestion. Poor appetite. Chronic diarrhea managed with Imodium 2 pills twice a day. Anywhere from 5-10 stools per day. Lately more or less 4-5 stools per day. Within 1-2 hours after a meal, will have diarrhea. Rare solid stool. No melena, brbpr. No vomiting or heartburn. Also on octreotide every two weeks.    Current Outpatient Prescriptions  Medication Sig Dispense Refill  . acetaminophen (TYLENOL) 500 MG tablet Take 500 mg by mouth every 6 (six) hours as needed.      Marland Kitchen amLODipine (NORVASC) 5 MG tablet Take 1 tablet (5 mg total) by mouth daily.  30 tablet  5  . Ascorbic Acid (VITAMIN C) 1000 MG tablet Take 1,000 mg by mouth daily. Taking 2 daily      . ibuprofen (ADVIL,MOTRIN) 200 MG tablet Take 200 mg by mouth every 6 (six) hours as needed.      Marland Kitchen KLOR-CON M10 10 MEQ  tablet Take 10 mEq by mouth. PRN      . loperamide (LOPERAMIDE A-D) 2 MG tablet Take 2 mg by mouth 4 (four) times daily as needed. Taking 2 every morning and 2 every evening      . meclizine (ANTIVERT) 25 MG tablet Take 25 mg by mouth 3 (three) times daily as needed.      . megestrol (MEGACE) 400 MG/10ML suspension Take 10 mLs (400 mg total) by mouth 2 (two) times daily.  600 mL  1  . Octreotide Acetate (SANDOSTATIN IJ) Inject as directed. Every 2 weeks      . ranitidine (ZANTAC) 300 MG capsule Take 1 capsule (300 mg total) by mouth 2 (two) times daily.  60 capsule  11   No current facility-administered medications for this visit.    Allergies as of 07/26/2013 - Review Complete 07/26/2013  Allergen Reaction Noted  . Amoxil [amoxicillin]  06/25/2013  . Lodine [etodolac]  10/06/2012  . Vasotec [enalapril] Cough 10/06/2012   Past Medical History  Diagnosis Date  . Fracture of ankle     right  . Hypertension   . Ulcer   . 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 She has a very elevated serum serotonin level to over 1600 and  she has an elevated 5-HIAA level in her urine.   . PUD (peptic ulcer disease)    Past Surgical History  Procedure Laterality Date  . Dilation and curettage of uterus    . Cholecystectomy    . Hernia repair      umbilical  . Ankle debridement      right for staff infection  . Colonoscopy  11/09/2003    NUR:Five small polyps.  One from cecum was snared.  The others were biopsied three in the area of hepatic flexure and one at rectum.  None of these polyps appeared to be malignant/Sigmoid colon diverticulosis  . Esophagogastroduodenoscopy   11/01/2003    RMR:Duodenal bulbar erosion/edema as described above/ 2. Distal esophagus, distal stomach, and pylorus appeared normal/Normal Ampulla of Vater as  described above  . Given capsule small bowel  11/09/2003    NUR:Small bowel polypoidal lesion with active bleeding, very  suspicious for primary carcinoid.  There was a second submucosal mass probably another metastatic lesion   History  Substance Use Topics  . Smoking status: Former Smoker -- 1.00 packs/day for 35 years    Types: Cigarettes  . Smokeless tobacco: Never Used     Comment: quit smoking 2 years ago  . Alcohol Use: Yes     Comment: occasionally for special occasions   Family History  Problem Relation Age of Onset  . Cancer Sister   . Cancer Sister   . Cancer Sister     ROS:  General: Negative for  fever, chills. See hpi. ENT: Negative for hoarseness, nasal congestion. CV: Negative for chest pain, angina, palpitations, dyspnea on exertion, peripheral edema.  Respiratory: Negative for dyspnea at rest, dyspnea on exertion, cough, sputum, wheezing.  GI: See history of present illness. GU:  Negative for dysuria, hematuria, urinary incontinence, urinary frequency, nocturnal urination.  Endo: see hpi.   Physical Examination:   BP 134/78  Pulse 76  Temp(Src) 97.6 F (36.4 C) (Oral)  Wt 159 lb 12.8 oz (72.485 kg)  General: Well-nourished, well-developed in no acute distress.  Eyes: No icterus. Mouth: Oropharyngeal mucosa moist and pink , no lesions erythema or exudate. Lungs: Clear to auscultation bilaterally.  Heart: Regular rate and rhythm, no murmurs rubs or gallops.  Abdomen: Bowel sounds are normal, full in RUQ with mild tenderness to palpation, nondistended, no rebound or guarding. Some moisture around umbilicus.    Extremities: No lower extremity edema. No clubbing or deformities. Neuro: Alert and oriented x 4   Skin: Warm and dry, no jaundice.   Psych: Alert and cooperative, normal mood and affect.  Labs:  Lab Results  Component Value Date   ALT 16 07/23/2013   AST 20 07/23/2013   ALKPHOS 174* 07/23/2013   BILITOT 0.3 07/23/2013   Lab Results    Component Value Date   CREATININE 0.98 07/23/2013   BUN 21 07/23/2013   NA 143 07/23/2013   K 3.9 07/23/2013   CL 104 07/23/2013   CO2 25 07/23/2013   Lab Results  Component Value Date   WBC 5.8 07/23/2013   HGB 13.1 07/23/2013   HCT 39.6 07/23/2013   MCV 87.0 07/23/2013   PLT 398 07/23/2013    Imaging Studies: No results found.      

## 2013-07-27 LAB — CHROMOGRANIN A: Chromogranin A: 514 ng/mL — ABNORMAL HIGH (ref 1.9–15.0)

## 2013-07-28 LAB — SEROTONIN SERUM: SEROTONIN, SERUM: 2337 ng/mL — AB (ref 56–244)

## 2013-07-31 ENCOUNTER — Encounter (HOSPITAL_COMMUNITY): Payer: Self-pay | Admitting: Pharmacy Technician

## 2013-07-31 NOTE — Progress Notes (Signed)
cc'd to pcp 

## 2013-08-02 ENCOUNTER — Telehealth: Payer: Self-pay | Admitting: Internal Medicine

## 2013-08-02 NOTE — Telephone Encounter (Signed)
EGD W/ED HAS BEEN APPROVED THROUGH [employer] PAC# [id number] EXP 11/04/13

## 2013-08-06 ENCOUNTER — Encounter (HOSPITAL_COMMUNITY): Payer: Medicare HMO | Attending: Oncology

## 2013-08-06 VITALS — BP 137/58 | HR 72 | Temp 97.9°F | Resp 20 | Wt 161.2 lb

## 2013-08-06 DIAGNOSIS — E340 Carcinoid syndrome: Secondary | ICD-10-CM

## 2013-08-06 DIAGNOSIS — D3A00 Benign carcinoid tumor of unspecified site: Secondary | ICD-10-CM | POA: Insufficient documentation

## 2013-08-06 DIAGNOSIS — C7A00 Malignant carcinoid tumor of unspecified site: Secondary | ICD-10-CM

## 2013-08-06 MED ORDER — OCTREOTIDE ACETATE 30 MG IM KIT
20.0000 mg | PACK | Freq: Once | INTRAMUSCULAR | Status: AC
  Administered 2013-08-06: 20 mg via INTRAMUSCULAR
  Filled 2013-08-06: qty 1

## 2013-08-06 NOTE — Progress Notes (Signed)
Patient tolerated sandostatin injection to right upper outer quad. Well.

## 2013-08-07 ENCOUNTER — Ambulatory Visit (HOSPITAL_COMMUNITY)
Admission: RE | Admit: 2013-08-07 | Discharge: 2013-08-07 | Disposition: A | Discharge status: Home / Self Care | Payer: Medicare HMO | Source: Ambulatory Visit | Attending: Internal Medicine | Admitting: Internal Medicine

## 2013-08-07 ENCOUNTER — Encounter (HOSPITAL_COMMUNITY): Payer: Self-pay | Admitting: *Deleted

## 2013-08-07 ENCOUNTER — Encounter (HOSPITAL_COMMUNITY)
Admission: RE | Disposition: A | Discharge status: Home / Self Care | Payer: Self-pay | Source: Ambulatory Visit | Attending: Internal Medicine

## 2013-08-07 DIAGNOSIS — R1310 Dysphagia, unspecified: Secondary | ICD-10-CM | POA: Insufficient documentation

## 2013-08-07 DIAGNOSIS — C787 Secondary malignant neoplasm of liver and intrahepatic bile duct: Secondary | ICD-10-CM | POA: Insufficient documentation

## 2013-08-07 DIAGNOSIS — I10 Essential (primary) hypertension: Secondary | ICD-10-CM | POA: Insufficient documentation

## 2013-08-07 DIAGNOSIS — K2100 Gastro-esophageal reflux disease with esophagitis, without bleeding: Secondary | ICD-10-CM | POA: Insufficient documentation

## 2013-08-07 DIAGNOSIS — R1319 Other dysphagia: Secondary | ICD-10-CM

## 2013-08-07 DIAGNOSIS — K222 Esophageal obstruction: Secondary | ICD-10-CM | POA: Insufficient documentation

## 2013-08-07 DIAGNOSIS — R1314 Dysphagia, pharyngoesophageal phase: Secondary | ICD-10-CM

## 2013-08-07 DIAGNOSIS — K219 Gastro-esophageal reflux disease without esophagitis: Secondary | ICD-10-CM

## 2013-08-07 HISTORY — PX: ESOPHAGOGASTRODUODENOSCOPY (EGD) WITH ESOPHAGEAL DILATION: SHX5812

## 2013-08-07 SURGERY — ESOPHAGOGASTRODUODENOSCOPY (EGD) WITH ESOPHAGEAL DILATION
Anesthesia: Moderate Sedation

## 2013-08-07 MED ORDER — LIDOCAINE VISCOUS 2 % MT SOLN
OROMUCOSAL | Status: DC | PRN
  Administered 2013-08-07: 4 mL via OROMUCOSAL

## 2013-08-07 MED ORDER — MEPERIDINE HCL 100 MG/ML IJ SOLN
INTRAMUSCULAR | Status: DC | PRN
Start: 2013-08-07 — End: 2013-08-07
  Administered 2013-08-07 (×2): 25 mg via INTRAVENOUS

## 2013-08-07 MED ORDER — MEPERIDINE HCL 100 MG/ML IJ SOLN
INTRAMUSCULAR | Status: AC
  Filled 2013-08-07: qty 2

## 2013-08-07 MED ORDER — SODIUM CHLORIDE 0.9 % IV SOLN
INTRAVENOUS | Status: DC
  Administered 2013-08-07: 11:00:00 via INTRAVENOUS

## 2013-08-07 MED ORDER — MIDAZOLAM HCL 5 MG/5ML IJ SOLN
INTRAMUSCULAR | Status: AC
  Filled 2013-08-07: qty 10

## 2013-08-07 MED ORDER — MIDAZOLAM HCL 5 MG/5ML IJ SOLN
INTRAMUSCULAR | Status: DC | PRN
  Administered 2013-08-07: 1 mg via INTRAVENOUS
  Administered 2013-08-07: 2 mg via INTRAVENOUS

## 2013-08-07 MED ORDER — STERILE WATER FOR IRRIGATION IR SOLN
Status: DC | PRN
  Administered 2013-08-07: 12:00:00

## 2013-08-07 MED ORDER — LIDOCAINE VISCOUS 2 % MT SOLN
OROMUCOSAL | Status: AC
  Filled 2013-08-07: qty 15

## 2013-08-07 MED ORDER — ONDANSETRON HCL 4 MG/2ML IJ SOLN
INTRAMUSCULAR | Status: AC
  Filled 2013-08-07: qty 2

## 2013-08-07 NOTE — Op Note (Signed)
[facility] [address] [place], [address]   ENDOSCOPY PROCEDURE REPORT  PATIENT: Sarah Casey, Sarah Casey  MR#: [id number] BIRTHDATE: [birth year] , 72  yrs. old GENDER: Female ENDOSCOPIST: R.  Garfield Cornea, MD FACP [facility] REFERRED BY:  Rosemary Holms, M.D. PROCEDURE DATE:  08/07/2013 PROCEDURE:     EGD with Venia Minks dilation followed by gastric biopsy  INDICATIONS:      GERD; esophageal dysphagia  INFORMED CONSENT:   The risks, benefits, limitations, alternatives and imponderables have been discussed.  The potential for biopsy, esophogeal dilation, etc. have also been reviewed.  Questions have been answered.  All parties agreeable.  Please see the history and physical in the medical record for more information.  MEDICATIONS:   Versed 3 mg IV and Demerol 50 mg IV in divided doses. Zofran 4 mg IV. Xylocaine gel 2% orally  DESCRIPTION OF PROCEDURE:   The [id number] ([id number])  endoscope was introduced through the mouth and advanced to the second portion of the duodenum without difficulty or limitations.  The mucosal surfaces were surveyed very carefully during advancement of the scope and upon withdrawal.  Retroflexion view of the proximal stomach and esophagogastric junction was performed.      FINDINGS: Distal esophageal ring with a tiny overlying erosions. No evidence of Barrett's esophagus. Stomach empty. Small hiatal hernia. Multiple antral erosions. No ulcer or infiltrating process. Patent pylorus. Normal first and second portion of the duodenum.  THERAPEUTIC / DIAGNOSTIC MANEUVERS PERFORMED:  A 54 French Maloney dilators passed to full insertion easily. A look back revealed some persistence of the ring without apparent complication. Subsequently, to disrupt the ring further, 2 quadrant "bites" of the ring were taken to additionally disrupt it. This was done effectively without apparent complication. Finally, biopsies of abnormal antral mucosa taken for  histologic study.   COMPLICATIONS:  None  IMPRESSION:   Schatzki's ring /  mild erosive reflux esophagitis-status post Maloney dilation and biopsy disruption of ring. Status post biopsy of abnormal gastric mucosa  RECOMMENDATIONS:  Stop ranitidine; begin Dexilant 60 mg daily-patient is to go by my office for free samples. Followup on pathology. Swallowing precautions reviewed with daughter.    _______________________________ R. Garfield Cornea, MD FACP [facility] eSigned:  R. Garfield Cornea, MD FACP [facility] 08/07/2013 12:39 PM     CC:  PATIENT NAME:  Tamar, Miano MR#: [id number]

## 2013-08-07 NOTE — H&P (View-Only) (Signed)
Primary Care Physician: Rubbie Battiest, MD  Primary Gastroenterologist:  Garfield Cornea, MD   Chief Complaint  Patient presents with  . Diarrhea  . Dysphagia    HPI: Sarah Casey is a 73 y.o. female here for further evaluation of dysphagia. 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 Primary carcinoid was in the small bowel. Originally diagnosed in 2005 but noted have a recurrence in 2013. She is on octreotide 20 mg every 2 weeks. She has had progression of disease by CT scan criteria her. Last CT in May of 2014. She is followed by oncology at [facility]. She weighed 185 pounds in February 2014. In November 2014 she weighed 169 pounds. She is down an additional 10 pounds today. Due for repeat CT abdomen and pelvis next month.  Saw Dr. Wolfgang Phoenix back in December complains of worsening reflux and dysphagia. Difficult swallowing. Every thing has to be very soft. Gets choked a lot. Food and some beverages feel like sticking in the upper esophagus. Lots of belching. Will belch up food. No vomiting or heartburn. Some indigestion. Poor appetite. Chronic diarrhea managed with Imodium 2 pills twice a day. Anywhere from 5-10 stools per day. Lately more or less 4-5 stools per day. Within 1-2 hours after a meal, will have diarrhea. Rare solid stool. No melena, brbpr. No vomiting or heartburn. Also on octreotide every two weeks.    Current Outpatient Prescriptions  Medication Sig Dispense Refill  . acetaminophen (TYLENOL) 500 MG tablet Take 500 mg by mouth every 6 (six) hours as needed.      Marland Kitchen amLODipine (NORVASC) 5 MG tablet Take 1 tablet (5 mg total) by mouth daily.  30 tablet  5  . Ascorbic Acid (VITAMIN C) 1000 MG tablet Take 1,000 mg by mouth daily. Taking 2 daily      . ibuprofen (ADVIL,MOTRIN) 200 MG tablet Take 200 mg by mouth every 6 (six) hours as needed.      Marland Kitchen KLOR-CON M10 10 MEQ  tablet Take 10 mEq by mouth. PRN      . loperamide (LOPERAMIDE A-D) 2 MG tablet Take 2 mg by mouth 4 (four) times daily as needed. Taking 2 every morning and 2 every evening      . meclizine (ANTIVERT) 25 MG tablet Take 25 mg by mouth 3 (three) times daily as needed.      . megestrol (MEGACE) 400 MG/10ML suspension Take 10 mLs (400 mg total) by mouth 2 (two) times daily.  600 mL  1  . Octreotide Acetate (SANDOSTATIN IJ) Inject as directed. Every 2 weeks      . ranitidine (ZANTAC) 300 MG capsule Take 1 capsule (300 mg total) by mouth 2 (two) times daily.  60 capsule  11   No current facility-administered medications for this visit.    Allergies as of 07/26/2013 - Review Complete 07/26/2013  Allergen Reaction Noted  . Amoxil [amoxicillin]  06/25/2013  . Lodine [etodolac]  10/06/2012  . Vasotec [enalapril] Cough 10/06/2012   Past Medical History  Diagnosis Date  . Fracture of ankle     right  . Hypertension   . Ulcer   . 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 She has a very elevated serum serotonin level to over 1600 and  she has an elevated 5-HIAA level in her urine.   . PUD (peptic ulcer disease)    Past Surgical History  Procedure Laterality Date  . Dilation and curettage of uterus    . Cholecystectomy    . Hernia repair      umbilical  . Ankle debridement      right for staff infection  . Colonoscopy  11/09/2003    KWI:OXBD small polyps.  One from cecum was snared.  The others were biopsied three in the area of hepatic flexure and one at rectum.  None of these polyps appeared to be malignant/Sigmoid colon diverticulosis  . Esophagogastroduodenoscopy   11/01/2003    ZHG:DJMEQAST bulbar erosion/edema as described above/ 2. Distal esophagus, distal stomach, and pylorus appeared normal/Normal Ampulla of Vater as  described above  . Given capsule small bowel  11/09/2003    MHD:QQIWL bowel polypoidal lesion with active bleeding, very  suspicious for primary carcinoid.  There was a second submucosal mass probably another metastatic lesion   History  Substance Use Topics  . Smoking status: Former Smoker -- 1.00 packs/day for 35 years    Types: Cigarettes  . Smokeless tobacco: Never Used     Comment: quit smoking 2 years ago  . Alcohol Use: Yes     Comment: occasionally for special occasions   Family History  Problem Relation Age of Onset  . Cancer Sister   . Cancer Sister   . Cancer Sister     ROS:  General: Negative for  fever, chills. See hpi. ENT: Negative for hoarseness, nasal congestion. CV: Negative for chest pain, angina, palpitations, dyspnea on exertion, peripheral edema.  Respiratory: Negative for dyspnea at rest, dyspnea on exertion, cough, sputum, wheezing.  GI: See history of present illness. GU:  Negative for dysuria, hematuria, urinary incontinence, urinary frequency, nocturnal urination.  Endo: see hpi.   Physical Examination:   BP 134/78  Pulse 76  Temp(Src) 97.6 F (36.4 C) (Oral)  Wt 159 lb 12.8 oz (72.485 kg)  General: Well-nourished, well-developed in no acute distress.  Eyes: No icterus. Mouth: Oropharyngeal mucosa moist and pink , no lesions erythema or exudate. Lungs: Clear to auscultation bilaterally.  Heart: Regular rate and rhythm, no murmurs rubs or gallops.  Abdomen: Bowel sounds are normal, full in RUQ with mild tenderness to palpation, nondistended, no rebound or guarding. Some moisture around umbilicus.    Extremities: No lower extremity edema. No clubbing or deformities. Neuro: Alert and oriented x 4   Skin: Warm and dry, no jaundice.   Psych: Alert and cooperative, normal mood and affect.  Labs:  Lab Results  Component Value Date   ALT 16 07/23/2013   AST 20 07/23/2013   ALKPHOS 174* 07/23/2013   BILITOT 0.3 07/23/2013   Lab Results    Component Value Date   CREATININE 0.98 07/23/2013   BUN 21 07/23/2013   NA 143 07/23/2013   K 3.9 07/23/2013   CL 104 07/23/2013   CO2 25 07/23/2013   Lab Results  Component Value Date   WBC 5.8 07/23/2013   HGB 13.1 07/23/2013   HCT 39.6 07/23/2013   MCV 87.0 07/23/2013   PLT 398 07/23/2013    Imaging Studies: No results found.

## 2013-08-07 NOTE — Discharge Instructions (Addendum)
EGD Discharge instructions Please read the instructions outlined below and refer to this sheet in the next few weeks. These discharge instructions provide you with general information on caring for yourself after you leave the hospital. Your doctor may also give you specific instructions. While your treatment has been planned according to the most current medical practices available, unavoidable complications occasionally occur. If you have any problems or questions after discharge, please call your doctor. ACTIVITY  You may resume your regular activity but move at a slower pace for the next 24 hours.   Take frequent rest periods for the next 24 hours.   Walking will help expel (get rid of) the air and reduce the bloated feeling in your abdomen.   No driving for 24 hours (because of the anesthesia (medicine) used during the test).   You may shower.   Do not sign any important legal documents or operate any machinery for 24 hours (because of the anesthesia used during the test).  NUTRITION  Drink plenty of fluids.   You may resume your normal diet.   Begin with a light meal and progress to your normal diet.   Avoid alcoholic beverages for 24 hours or as instructed by your caregiver.  MEDICATIONS  You may resume your normal medications unless your caregiver tells you otherwise.  WHAT YOU CAN EXPECT TODAY  You may experience abdominal discomfort such as a feeling of fullness or gas pains.  FOLLOW-UP  Your doctor will discuss the results of your test with you.  SEEK IMMEDIATE MEDICAL ATTENTION IF ANY OF THE FOLLOWING OCCUR:  Excessive nausea (feeling sick to your stomach) and/or vomiting.   Severe abdominal pain and distention (swelling).   Trouble swallowing.   Temperature over 101 F (37.8 C).   Rectal bleeding or vomiting of blood.    Stop ranitidine; begin Dexilant 60 mg daily  GERD information provided  Further recommendations to follow pending review of  pathology report  Swallowing precautions reviewed with the daughter    Diet for Gastroesophageal Reflux Disease, Adult Reflux (acid reflux) is when acid from your stomach flows up into the esophagus. When acid comes in contact with the esophagus, the acid causes irritation and soreness (inflammation) in the esophagus. When reflux happens often or so severely that it causes damage to the esophagus, it is called gastroesophageal reflux disease (GERD). Nutrition therapy can help ease the discomfort of GERD. FOODS OR DRINKS TO AVOID OR LIMIT Smoking or chewing tobacco. Nicotine is one of the most potent stimulants to acid production in the gastrointestinal tract. Caffeinated and decaffeinated coffee and black tea. Regular or low-calorie carbonated beverages or energy drinks (caffeine-free carbonated beverages are allowed).  Strong spices, such as black pepper, white pepper, red pepper, cayenne, curry powder, and chili powder. Peppermint or spearmint. Chocolate. High-fat foods, including meats and fried foods. Extra added fats including oils, butter, salad dressings, and nuts. Limit these to less than 8 tsp per day. Fruits and vegetables if they are not tolerated, such as citrus fruits or tomatoes. Alcohol. Any food that seems to aggravate your condition. If you have questions regarding your diet, call your caregiver or a registered dietitian. OTHER THINGS THAT MAY HELP GERD INCLUDE:  Eating your meals slowly, in a relaxed setting. Eating 5 to 6 small meals per day instead of 3 large meals. Eliminating food for a period of time if it causes distress. Not lying down until 3 hours after eating a meal. Keeping the head of your bed raised  6 to 9 inches (15 to 23 cm) by using a foam wedge or blocks under the legs of the bed. Lying flat may make symptoms worse. Being physically active. Weight loss may be helpful in reducing reflux in overweight or obese adults. Wear loose fitting clothing EXAMPLE  MEAL PLAN This meal plan is approximately 2,000 calories based on https://www.bernard.org/ChooseMyPlate.gov meal planning guidelines. Breakfast  cup cooked oatmeal. 1 cup strawberries. 1 cup low-fat milk. 1 oz almonds. Snack 1 cup cucumber slices. 6 oz yogurt (made from low-fat or fat-free milk). Lunch 2 slice whole-wheat bread. 2 oz sliced [place]. 2 tsp mayonnaise. 1 cup blueberries. 1 cup snap peas. Snack 6 whole-wheat crackers. 1 oz string cheese. Dinner  cup brown rice. 1 cup mixed veggies. 1 tsp olive oil. 3 oz grilled fish. Document Released: 06/14/2005 Document Revised: 09/06/2011 Document Reviewed: 04/30/2011 [facility] Patient Information 2014 [place], [place]. Gastroesophageal Reflux Disease, Adult Gastroesophageal reflux disease (GERD) happens when acid from your stomach flows up into the esophagus. When acid comes in contact with the esophagus, the acid causes soreness (inflammation) in the esophagus. Over time, GERD may create small holes (ulcers) in the lining of the esophagus. CAUSES  Increased body weight. This puts pressure on the stomach, making acid rise from the stomach into the esophagus. Smoking. This increases acid production in the stomach. Drinking alcohol. This causes decreased pressure in the lower esophageal sphincter (valve or ring of muscle between the esophagus and stomach), allowing acid from the stomach into the esophagus. Late evening meals and a full stomach. This increases pressure and acid production in the stomach. A malformed lower esophageal sphincter. Sometimes, no cause is found. SYMPTOMS  Burning pain in the lower part of the mid-chest behind the breastbone and in the mid-stomach area. This may occur twice a week or more often. Trouble swallowing. Sore throat. Dry cough. Asthma-like symptoms including chest tightness, shortness of breath, or wheezing. DIAGNOSIS  Your caregiver may be able to diagnose GERD based on your symptoms. In some cases, X-rays and  other tests may be done to check for complications or to check the condition of your stomach and esophagus. TREATMENT  Your caregiver may recommend over-the-counter or prescription medicines to help decrease acid production. Ask your caregiver before starting or adding any new medicines.  HOME CARE INSTRUCTIONS  Change the factors that you can control. Ask your caregiver for guidance concerning weight loss, quitting smoking, and alcohol consumption. Avoid foods and drinks that make your symptoms worse, such as: Caffeine or alcoholic drinks. Chocolate. Peppermint or mint flavorings. Garlic and onions. Spicy foods. Citrus fruits, such as oranges, lemons, or limes. Tomato-based foods such as sauce, chili, salsa, and pizza. Fried and fatty foods. Avoid lying down for the 3 hours prior to your bedtime or prior to taking a nap. Eat small, frequent meals instead of large meals. Wear loose-fitting clothing. Do not wear anything tight around your waist that causes pressure on your stomach. Raise the head of your bed 6 to 8 inches with wood blocks to help you sleep. Extra pillows will not help. Only take over-the-counter or prescription medicines for pain, discomfort, or fever as directed by your caregiver. Do not take aspirin, ibuprofen, or other nonsteroidal anti-inflammatory drugs (NSAIDs). SEEK IMMEDIATE MEDICAL CARE IF:  You have pain in your arms, neck, jaw, teeth, or back. Your pain increases or changes in intensity or duration. You develop nausea, vomiting, or sweating (diaphoresis). You develop shortness of breath, or you faint. Your vomit is green, yellow,  black, or looks like coffee grounds or blood. Your stool is red, bloody, or black. These symptoms could be signs of other problems, such as heart disease, gastric bleeding, or esophageal bleeding. MAKE SURE YOU:  Understand these instructions. Will watch your condition. Will get help right away if you are not doing well or get  worse. Document Released: 03/24/2005 Document Revised: 09/06/2011 Document Reviewed: 01/01/2011 [facility] Patient Information 2014 [place], [place].

## 2013-08-07 NOTE — Interval H&P Note (Signed)
History and Physical Interval Note:  08/07/2013 11:59 AM  Sarah Casey  has presented today for surgery, with the diagnosis of DYSPHAGIA  AND GERD  The various methods of treatment have been discussed with the patient and family. After consideration of risks, benefits and other options for treatment, the patient has consented to  Procedure(s) with comments: ESOPHAGOGASTRODUODENOSCOPY (EGD) WITH ESOPHAGEAL DILATION (N/A) - 11:30 as a surgical intervention .  The patient's history has been reviewed, patient examined, no change in status, stable for surgery.  I have reviewed the patient's chart and labs.  Questions were answered to the patient's satisfaction.      No change. EGD with possible esophageal dilation, etc. as appropriate. The risks, benefits, limitations, alternatives and imponderables have been reviewed with the patient. Potential for esophageal dilation, biopsy, etc. have also been reviewed.  Questions have been answered. All parties agreeable.   Manus Rudd

## 2013-08-09 ENCOUNTER — Encounter: Payer: Self-pay | Admitting: Internal Medicine

## 2013-08-10 ENCOUNTER — Encounter (HOSPITAL_COMMUNITY): Payer: Self-pay | Admitting: Internal Medicine

## 2013-08-10 ENCOUNTER — Telehealth (HOSPITAL_COMMUNITY): Payer: Self-pay | Admitting: Oncology

## 2013-08-10 ENCOUNTER — Telehealth: Payer: Self-pay | Admitting: Internal Medicine

## 2013-08-10 NOTE — Telephone Encounter (Signed)
PER TOM OK TO RESCHEDULE CT APPT TO THE 2/19 DUE TO AUTH ISSUE WITH INS. CALLED RAD SPOKE TO [place] RESCHEDULED APPT  [facility] [phone]

## 2013-08-10 NOTE — Telephone Encounter (Signed)
Pt called--seeing a little BRBPR w BM's p 24 hrs--only pinch bx/s taken at EGD.  I doubt seeing blood from that procedure.  This is a new Sx; 10 yrs since last TCS.  If bleeding worsens, to the ED; otherwise, will f/u w her next week

## 2013-08-13 ENCOUNTER — Ambulatory Visit (HOSPITAL_COMMUNITY): Payer: Medicare HMO

## 2013-08-13 NOTE — Telephone Encounter (Signed)
Spoke with pt- as far as the bleeding is concerned, she has not seen any more blood and the diarrhea is better since she spoke with RMR. But her other symptoms she was having, difficulty swallowing, getting choked when she eats and spitting up "brown water" is not getting any better with the dexilant. She is going to give it a few more days and she thinks she may want to try a different ppi (pt was on zantac 150mg  bid and it wasn't helping either)

## 2013-08-14 ENCOUNTER — Ambulatory Visit (HOSPITAL_COMMUNITY): Payer: Medicare HMO | Admitting: Oncology

## 2013-08-14 NOTE — Telephone Encounter (Signed)
She will need an OV w extender

## 2013-08-15 NOTE — Telephone Encounter (Signed)
Susan, please schedule ov 

## 2013-08-16 ENCOUNTER — Ambulatory Visit (HOSPITAL_COMMUNITY): Payer: Medicare HMO | Admitting: Oncology

## 2013-08-16 ENCOUNTER — Ambulatory Visit (HOSPITAL_COMMUNITY): Payer: Medicare HMO

## 2013-08-16 NOTE — Telephone Encounter (Signed)
Appointment made(March [phone] with LSL)  and letter mailed

## 2013-08-20 ENCOUNTER — Ambulatory Visit (HOSPITAL_COMMUNITY): Payer: Medicare HMO

## 2013-08-20 ENCOUNTER — Encounter (HOSPITAL_COMMUNITY): Payer: Medicare HMO

## 2013-08-20 ENCOUNTER — Ambulatory Visit (HOSPITAL_COMMUNITY): Payer: Medicare HMO | Admitting: Oncology

## 2013-08-21 ENCOUNTER — Ambulatory Visit (HOSPITAL_COMMUNITY)
Admission: RE | Admit: 2013-08-21 | Discharge: 2013-08-21 | Disposition: A | Discharge status: Home / Self Care | Payer: Medicare HMO | Source: Ambulatory Visit | Attending: Oncology | Admitting: Oncology

## 2013-08-21 DIAGNOSIS — C7951 Secondary malignant neoplasm of bone: Secondary | ICD-10-CM | POA: Insufficient documentation

## 2013-08-21 DIAGNOSIS — R911 Solitary pulmonary nodule: Secondary | ICD-10-CM | POA: Insufficient documentation

## 2013-08-21 DIAGNOSIS — C7A00 Malignant carcinoid tumor of unspecified site: Secondary | ICD-10-CM | POA: Insufficient documentation

## 2013-08-21 DIAGNOSIS — R599 Enlarged lymph nodes, unspecified: Secondary | ICD-10-CM | POA: Insufficient documentation

## 2013-08-21 DIAGNOSIS — C787 Secondary malignant neoplasm of liver and intrahepatic bile duct: Secondary | ICD-10-CM | POA: Insufficient documentation

## 2013-08-21 DIAGNOSIS — D3A00 Benign carcinoid tumor of unspecified site: Secondary | ICD-10-CM

## 2013-08-21 DIAGNOSIS — C7952 Secondary malignant neoplasm of bone marrow: Secondary | ICD-10-CM

## 2013-08-21 MED ORDER — IOHEXOL 300 MG/ML  SOLN
100.0000 mL | Freq: Once | INTRAMUSCULAR | Status: AC | PRN
  Administered 2013-08-21: 100 mL via INTRAVENOUS

## 2013-08-23 NOTE — Progress Notes (Signed)
Rubbie Battiest, MD [place] [address]  Metastatic recurrent carcinoid - Plan: NM OCTREOTIDE LOCALIZATION W/SPECT, octreotide (SANDOSTATIN LAR) IM injection 20 mg  CURRENT THERAPY:Octreotide 20 mg every 2 weeks.  Will switch to Lanreotide in 2 weeks.    INTERVAL HISTORY: Sarah Casey 73 y.o. 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 She continues to display progression of disease by CT scan criteria. Symptomatically, she is doing better on 20 mg of Octreotide every 14 days (compared to 40 mg every 4 weeks).  I personally reviewed and went over laboratory results with the patient.  The results are noted within this dictation.  I personally reviewed and went over radiographic studies with the patient.  The results are noted within this dictation.  Her CT imaging study on 08/22/13 demonstrates gross progression of disease which is the natural course of her malignancy.  The CT scan's impression reads: 1. Mild progression of bilateral pulmonary nodules at the lung bases, measuring up to 10 mm, as above.  2. Progression of innumerable hepatic metastases, measuring up to 7.8 cm, as above.  3. Mild progression of retroperitoneal lymphadenopathy, measuring up to 2.5 cm short axis.  4. Multifocal osseous metastases in the thoracolumbar spine, new/increased.  With this progression of disease, the patient has three options: 1. Continue Octreotide injections 2. Change therapy to Lanreotide.  The ELECT trial has demonstrated lanreotide's effectiveness as a rescue therapy.  This trial has confirmed lanreotide's positive benefit-risk profile and safety/tolerability. -J Clin Oncol 32, 2014 (suppl 3; abstr 268)- 3. Performing an MIBG scan and if positive, refer the patient to [facility] for MIBG treatment.   We discussed these options in  detail. Dr. Barnet Glasgow was gracious enough to visit with the patient today as well to discuss these options.  We reviewed these options in detail. She is agreeable with the following plan: 1. Octreotide 20 mg injection today 2. Lanreotide 90 mg in 2 weeks. This will afford [place] the opportunity get this approved for her insurance company and also our pharmacy to ascertain this medication. We discussed the risks, benefits, alternatives, and side effects of this therapy. 3. In the interim, we will get an MIBG scan to see if she may be a candidate for MIBG treatment at [facility]. We briefly discussed the risks, benefits, alternatives, side effects of this therapy.  She complains of back pain which is being worked on by her Restaurant manager, fast food.  She notes improvement in her back pain following her chiropractor appointments. On CT scan, she's noted to have new and increased bone metastases including to the L2 and L5 area which appear be her big back pain complaints. I'm not sure if these are the cause of her back pain, but her CT report was delivered to the patient so she can provide this information to her chiropractor.  She continues to have diarrhea. She notes that she takes Imodium for this. She reports 3-10 bowel movements a loose stool daily. Of note, she appreciates a trend of increased diarrhea when he gets nearer the time of her next octreotide injection. This is an indication of the octreotide not managing her side effects of malignancy as a medication wean out of her system.  Oncologically, she otherwise denies any complaints and ROS questioning is negative   Past Medical History  Diagnosis Date  . Fracture of ankle  right  . Hypertension   . Ulcer   . 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 She has a very elevated serum  serotonin level to over 1600 and she has an elevated 5-HIAA level in her urine.   . PUD (peptic ulcer disease)     has Metastatic recurrent carcinoid; Essential hypertension, benign; Esophageal reflux; Allergic rhinitis; and Esophageal dysphagia on her problem list.     is allergic to amoxil; lodine; and vasotec.  Sarah Casey had no medications administered during this visit.  Past Surgical History  Procedure Laterality Date  . Dilation and curettage of uterus    . Cholecystectomy    . Hernia repair      umbilical  . Ankle debridement      right for staff infection  . Colonoscopy  11/09/2003    FYB:OFBP small polyps.  One from cecum was snared.  The others were biopsied three in the area of hepatic flexure and one at rectum.  None of these polyps appeared to be malignant/Sigmoid colon diverticulosis  . Esophagogastroduodenoscopy   11/01/2003    ZWC:HENIDPOE bulbar erosion/edema as described above/ 2. Distal esophagus, distal stomach, and pylorus appeared normal/Normal Ampulla of Vater as described above  . Given capsule small bowel  11/09/2003    UMP:NTIRW bowel polypoidal lesion with active bleeding, very  suspicious for primary carcinoid.  There was a second submucosal mass probably another metastatic lesion  . Esophagogastroduodenoscopy (egd) with esophageal dilation N/A 08/07/2013    Procedure: ESOPHAGOGASTRODUODENOSCOPY (EGD) WITH ESOPHAGEAL DILATION;  Surgeon: Daneil Dolin, MD;  Location: [facility];  Service: Endoscopy;  Laterality: N/A;  11:30    Denies any headaches, dizziness, double vision, fevers, chills, night sweats, nausea, vomiting, constipation, chest pain, heart palpitations, shortness of breath, blood in stool, black tarry stool, urinary pain, urinary burning, urinary frequency, hematuria.   PHYSICAL EXAMINATION  ECOG PERFORMANCE STATUS: 1 - Symptomatic but completely ambulatory  Filed Vitals:   08/24/13 1015  BP: 143/62  Pulse: 70  Temp: 98 F (36.7 C)    Resp: 18    GENERAL:alert, no distress, well nourished, well developed, comfortable, cooperative, obese and smiling SKIN: skin color, texture, turgor are normal, no rashes or significant lesions HEAD: Normocephalic, No masses, lesions, tenderness or abnormalities EYES: normal, PERRLA, EOMI, Conjunctiva are pink and non-injected EARS: External ears normal OROPHARYNX:mucous membranes are moist  NECK: supple, no adenopathy, trachea midline LYMPH:  not examined BREAST:not examined LUNGS: not examined HEART: not examined ABDOMEN:not examined BACK: Back symmetric, no curvature. EXTREMITIES:less then 2 second capillary refill, no joint deformities, effusion, or inflammation, no skin discoloration, no cyanosis  NEURO: alert & oriented x 3 with fluent speech, no focal motor/sensory deficits, gait normal   LABORATORY DATA: CBC    Component Value Date/Time   WBC 7.9 08/24/2013 1007   RBC 4.08 08/24/2013 1007   HGB 11.8* 08/24/2013 1007   HCT 35.9* 08/24/2013 1007   PLT 294 08/24/2013 1007   MCV 88.0 08/24/2013 1007   MCH 28.9 08/24/2013 1007   MCHC 32.9 08/24/2013 1007   RDW 15.3 08/24/2013 1007   LYMPHSABS 1.4 08/24/2013 1007   MONOABS 0.7 08/24/2013 1007   EOSABS 0.2 08/24/2013 1007   BASOSABS 0.0 08/24/2013 1007      Chemistry      Component Value Date/Time   NA 138 08/24/2013 1007  K 3.5* 08/24/2013 1007   CL 100 08/24/2013 1007   CO2 27 08/24/2013 1007   BUN 20 08/24/2013 1007   CREATININE 0.81 08/24/2013 1007      Component Value Date/Time   CALCIUM 8.9 08/24/2013 1007   ALKPHOS 200* 08/24/2013 1007   AST 21 08/24/2013 1007   ALT 17 08/24/2013 1007   BILITOT 0.6 08/24/2013 1007    Results for Sarah Casey, Sarah Casey (MRN [id number]) as of 08/23/2013 11:23  Ref. Range 07/23/2013 10:04  Serotonin, Serum Latest Range: 56-244 ng/mL 2337 (H)   Results for Sarah Casey, Sarah Casey (MRN [id number]) as of 08/23/2013 11:23  Ref. Range 07/23/2013 10:04  Chromogranin A Latest Range: 1.9-15.0 ng/mL 514.0  (H)     RADIOGRAPHIC STUDIES:  08/22/2013  CLINICAL DATA: Follow-up metastatic carcinoid  EXAM:  CT ABDOMEN AND PELVIS WITH CONTRAST  TECHNIQUE:  Multidetector CT imaging of the abdomen and pelvis was performed  using the standard protocol following bolus administration of  intravenous contrast.  CONTRAST: 155mL OMNIPAQUE IOHEXOL 300 MG/ML SOLN  COMPARISON: 02/05/2013  FINDINGS:  Mild progression of bilateral pulmonary nodules at the lung bases,  including:  --8 mm right lower lobe nodule (series 3/image 1), previously 6 mm  --7 mm left lower lobe nodule (series 3/image 4), unchanged  --10 mm nodule to medial right lung base (series 3/image 8),  unchanged  --5 mm nodule of the medial left lung base (series 3/image 80)  previously 3 mm  Innumerable hepatic metastases, progressed. Index lesions include:  --4.4 x 5.8 cm necrotic lesion posteriorly in the lateral segment  left hepatic lobe (series 2/ image 20), previously 5.2 x 7.1 cm  --2.0 x 2.0 cm lesion inferiorly in the posterior segment right  hepatic lobe (series 2/ image 25), new  --4.1 x 3.4 cm in the anterior hepatic dome (series 2/ image 1),  previously 2.3 x 2.5 cm  --3.2 x 3.4 cm lesion in the anterior segment right hepatic lobe  (series 2/ image 9), previously 2.4 x 2.2 cm  --7.8 x 7.4 cm lesion in the medial segment left hepatic lobe  (series 2/ image 11), previously 6.2 x 6.1 cm  --5.6 x 7.0 cm lesion in the lateral segment left hepatic lobe  (series 2/ image 15), previously 5.2 x 4.6 cm  Spleen and adrenal glands are within normal limits.  Fatty atrophy of the pancreatic body. Pancreas is mildly prominent  at the junction of the pancreatic body and tail (series 2/ image  24), attention on follow-up is suggested.  Status post cholecystectomy. No intrahepatic or extrahepatic ductal  dilatation.  Kidneys are within normal limits. No hydronephrosis.  No evidence of bowel obstruction. Normal appendix. Colonic   diverticulosis, without associated inflammatory changes.  Atherosclerotic calcifications of the abdominal aorta and branch  vessels.  Trace abdominopelvic ascites.  Retroperitoneal lymphadenopathy, including:  --12 mm short axis anterior para-aortic node (series 2/ image 26),  previously 11 mm  --13 mm short axis retrocaval node (series 2/image 28), unchanged  --12 mm short axis left para-aortic node (series 2/ image 27),  previously 11 mm  --25 mm short axis left para-aortic node (series 2/ image 34),  unchanged  --17 mm short axis left para-aortic node (series 2/ image 40),  previously 14 mm  Uterus and bilateral ovaries are unremarkable.  Bladder is mildly thick-walled although underdistended.  Multifocal osseous metastases in the thoracolumbar spine, including  the along the superior endplate of T9 (sagittal image 54) and  inferior endplates of L2  and L5 (sagittal image 55), new/increased.  No evidence of pathologic fracture.  IMPRESSION:  Mild progression of bilateral pulmonary nodules at the lung bases,  measuring up to 10 mm, as above.  Progression of innumerable hepatic metastases, measuring up to 7.8  cm, as above.  Mild progression of retroperitoneal lymphadenopathy, measuring up to  2.5 cm short axis.  Multifocal osseous metastases in the thoracolumbar spine,  new/increased.  Electronically Signed  By: Julian Hy M.D.  On: 08/21/2013 11:47    ASSESSMENT:  1. 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 She continues to display progression of disease by RECIST criteria. Symptomatically, she is doing better on 20 mg of Octreotide every 14 days (compared to 40 mg every 4 weeks).  2. Diarrhea, secondary to #1. Improved on 20 mg every 14 days.  3. Umbilical wound, healing nicely with supportive care with Neosporin.  4. Weight loss.  Patient Active Problem List   Diagnosis  Date Noted  . Esophageal dysphagia 07/26/2013  . Essential hypertension, benign 10/15/2012  . Esophageal reflux 10/15/2012  . Allergic rhinitis 10/15/2012  . 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 I personally reviewed and went over laboratory results with the patient.  The results are noted within this dictation. 2. I personally reviewed and went over radiographic studies with the patient.  The results are noted within this dictation.   3. Discussed treatment options as outlined above:  A. Continue Octreotide  B. Switch to Lanreotide  C. MIBG scan and if positive, refer to [facility] for [place] treatment. 4. Octreotide 20 mg Injection today 5. Switch to Lanreotide 90 mg Sq in 2 weeks and then every 4 weeks. 6. Lanreotide supportive therapy plan built.  7. Octreotide supportive therapy plan deleted; following today's injection 8. MIBG scan in 2 weeks 9. Return in 4 weeks for follow-up.  This will be 2 weeks after Lanreotide injection and we will evaluate tolerability and malignancy-induced diarrhea control.   THERAPY PLAN:  We will administer Octreotide injection today as planned.  We will get Lanreotide approved through her insurance company and plan for her to switch to that medication in 2 weeks.  Additionally, we will order a MIBG scan in 2 weeks to see if she may be a candidate in the future for MIBG treatment at [facility].  All questions were answered. The patient knows to call the clinic with any problems, questions or concerns. We can certainly see the patient much sooner if necessary.  Patient and plan discussed with Dr. Farrel Gobble and he is in agreement with the aforementioned.   I spent 45 minutes counseling the patient face to face. The total time spent in the appointment was 55 minutes.  KEFALAS,THOMAS

## 2013-08-24 ENCOUNTER — Encounter (HOSPITAL_BASED_OUTPATIENT_CLINIC_OR_DEPARTMENT_OTHER): Payer: Medicare HMO | Admitting: Oncology

## 2013-08-24 ENCOUNTER — Encounter (HOSPITAL_COMMUNITY): Payer: Medicare HMO

## 2013-08-24 ENCOUNTER — Encounter (HOSPITAL_BASED_OUTPATIENT_CLINIC_OR_DEPARTMENT_OTHER): Payer: Medicare HMO

## 2013-08-24 VITALS — BP 143/62 | HR 70 | Temp 98.0°F | Resp 18 | Wt 160.8 lb

## 2013-08-24 DIAGNOSIS — E340 Carcinoid syndrome: Secondary | ICD-10-CM

## 2013-08-24 DIAGNOSIS — C787 Secondary malignant neoplasm of liver and intrahepatic bile duct: Secondary | ICD-10-CM

## 2013-08-24 DIAGNOSIS — C7A00 Malignant carcinoid tumor of unspecified site: Secondary | ICD-10-CM

## 2013-08-24 DIAGNOSIS — D3A00 Benign carcinoid tumor of unspecified site: Secondary | ICD-10-CM

## 2013-08-24 DIAGNOSIS — C7B8 Other secondary neuroendocrine tumors: Secondary | ICD-10-CM

## 2013-08-24 LAB — COMPREHENSIVE METABOLIC PANEL
ALT: 17 U/L (ref 0–35)
AST: 21 U/L (ref 0–37)
Albumin: 3.5 g/dL (ref 3.5–5.2)
Alkaline Phosphatase: 200 U/L — ABNORMAL HIGH (ref 39–117)
BILIRUBIN TOTAL: 0.6 mg/dL (ref 0.3–1.2)
BUN: 20 mg/dL (ref 6–23)
CHLORIDE: 100 meq/L (ref 96–112)
CO2: 27 meq/L (ref 19–32)
CREATININE: 0.81 mg/dL (ref 0.50–1.10)
Calcium: 8.9 mg/dL (ref 8.4–10.5)
GFR, EST AFRICAN AMERICAN: 82 mL/min — AB (ref >90)
GFR, EST NON AFRICAN AMERICAN: 70 mL/min — AB (ref >90)
GLUCOSE: 125 mg/dL — AB (ref 70–99)
Potassium: 3.5 mEq/L — ABNORMAL LOW (ref 3.7–5.3)
Sodium: 138 mEq/L (ref 137–147)
Total Protein: 7.2 g/dL (ref 6.0–8.3)

## 2013-08-24 LAB — CBC WITH DIFFERENTIAL/PLATELET
BASOS ABS: 0 10*3/uL (ref 0.0–0.1)
Basophils Relative: 0 % (ref 0–1)
Eosinophils Absolute: 0.2 10*3/uL (ref 0.0–0.7)
Eosinophils Relative: 2 % (ref 0–5)
HEMATOCRIT: 35.9 % — AB (ref 36.0–46.0)
HEMOGLOBIN: 11.8 g/dL — AB (ref 12.0–15.0)
LYMPHS ABS: 1.4 10*3/uL (ref 0.7–4.0)
LYMPHS PCT: 18 % (ref 12–46)
MCH: 28.9 pg (ref 26.0–34.0)
MCHC: 32.9 g/dL (ref 30.0–36.0)
MCV: 88 fL (ref 78.0–100.0)
MONO ABS: 0.7 10*3/uL (ref 0.1–1.0)
MONOS PCT: 9 % (ref 3–12)
Neutro Abs: 5.5 10*3/uL (ref 1.7–7.7)
Neutrophils Relative %: 70 % (ref 43–77)
Platelets: 294 10*3/uL (ref 150–400)
RBC: 4.08 MIL/uL (ref 3.87–5.11)
RDW: 15.3 % (ref 11.5–15.5)
WBC: 7.9 10*3/uL (ref 4.0–10.5)

## 2013-08-24 MED ORDER — OCTREOTIDE ACETATE 30 MG IM KIT
20.0000 mg | PACK | Freq: Once | INTRAMUSCULAR | Status: AC
  Administered 2013-08-24: 20 mg via INTRAMUSCULAR
  Filled 2013-08-24: qty 1

## 2013-08-24 NOTE — Progress Notes (Signed)
Sarah Casey presents today for injection per MD orders. Sandostatin 20 mg administered IM z-track left gluteal . Administration without incident. Patient tolerated well.

## 2013-08-24 NOTE — Progress Notes (Signed)
Labs drawn today for cbc/diff,cmp,Serotonin,chromogranin A

## 2013-08-24 NOTE — Patient Instructions (Addendum)
Sarah Casey Discharge Instructions  RECOMMENDATIONS MADE BY THE CONSULTANT AND ANY TEST RESULTS WILL BE SENT TO YOUR REFERRING PHYSICIAN.  EXAM FINDINGS BY THE PHYSICIAN TODAY AND SIGNS OR SYMPTOMS TO REPORT TO CLINIC OR PRIMARY PHYSICIAN: Exam and findings as discussed by Robynn Pane PA- C and Dr. Barnet Glasgow.  Will get you scheduled for octreotide scan here.  Will give you your Sandostatin today and start the new medication Lantreotide in 2 weeks and see you back in 4 weeks.  MEDICATIONS PRESCRIBED:  none  INSTRUCTIONS/FOLLOW-UP: Octreotide scan will start on Monday 09/03/13 at 2pm in Radiology Purchase a bottle of Magnesia Citrate and take it with you to your scan on Monday.  Radiology will let you know when to take it. Beginning Sunday eat light.  Recommendations are broths, clear soups, etc. The scan may take up to 5 days for completion.  Radiology will let you know when and how often you will need to come back.  Injection in 2 weeks and follow-up in 4 weeks.  Thank you for choosing [facility] to provide your oncology and hematology care.  To afford each patient quality time with our providers, please arrive at least 15 minutes before your scheduled appointment time.  With your help, our goal is to use those 15 minutes to complete the necessary work-up to ensure our physicians have the information they need to help with your evaluation and healthcare recommendations.    Effective January 1st, 2014, we ask that you re-schedule your appointment with our physicians should you arrive 10 or more minutes late for your appointment.  We strive to give you quality time with our providers, and arriving late affects you and other patients whose appointments are after yours.    Again, thank you for choosing [facility].  Our hope is that these requests will decrease the amount of time that you wait before being seen by our physicians.        _____________________________________________________________  Should you have questions after your visit to [facility], please contact our office at [phone] between the hours of 8:30 a.m. and 5:00 p.m.  Voicemails left after 4:30 p.m. will not be returned until the following business day.  For prescription refill requests, have your pharmacy contact our office with your prescription refill request.     Lanreotide injection What is this medicine? LANREOTIDE (lan REE oh tide) is used to reduce blood levels of growth hormone in patients with a condition called acromegaly. It is used when surgery or radiotherapy have not worked well enough or in patients who are not able to have such procedures. This medicine may be used for other purposes; ask your health care provider or pharmacist if you have questions. COMMON BRAND NAME(S): Somatuline Depot  What should I tell my health care provider before I take this medicine? They need to know if you have any of these conditions: -diabetes -gallbladder disease -heart disease -kidney disease -liver disease -an unusual or allergic reaction to lanreotide, other medicines, latex, foods, dyes, or preservatives -pregnant or trying to get pregnant -breast-feeding How should I use this medicine? This medicine is for injection under the skin. It is given by a health care professional in a hospital or clinic setting. Contact your pediatrician or health care professional regarding the use of this medicine in children. Special care may be needed. Overdosage: If you think you have taken too much of this medicine contact a poison control  center or emergency room at once. NOTE: This medicine is only for you. Do not share this medicine with others. What if I miss a dose? It is important not to miss your dose. Call your doctor or health care professional if you are unable to keep an appointment. What may interact with this  medicine? -bromocriptine -cyclosporine -medicines for diabetes, including insulin -medicines for heart disease or hypertension -quinidine This list may not describe all possible interactions. Give your health care provider a list of all the medicines, herbs, non-prescription drugs, or dietary supplements you use. Also tell them if you smoke, drink alcohol, or use illegal drugs. Some items may interact with your medicine. What should I watch for while using this medicine? Visit your doctor or health care professional for regular checks on your progress. Your condition will be monitored carefully while you are receiving this medicine. This medicine may cause increases or decreases in blood sugar. Signs of high blood sugar include frequent urination, unusual thirst, flushed or dry skin, difficulty breathing, drowsiness, stomach ache, nausea, vomiting or dry mouth. Signs of low blood sugar include chills, cool, pale skin or cold sweats, drowsiness, extreme hunger, fast heartbeat, headache, nausea, nervousness or anxiety, shakiness, trembling, unsteadiness, tiredness, or weakness. Contact your doctor or health care professional right away if you experience any of these symptoms. What side effects may I notice from receiving this medicine? Side effects that you should report to your doctor or health care professional as soon as possible: -allergic reactions like skin rash, itching or hives, swelling of the face, lips, or tongue -changes in blood sugar -changes in heart rate -severe stomach pain Side effects that usually do not require medical attention (report to your doctor or health care professional if they continue or are bothersome): -diarrhea or constipation -gas or stomach pain -nausea, vomiting -pain, redness, swelling and irritation at site where injected This list may not describe all possible side effects. Call your doctor for medical advice about side effects. You may report side effects  to FDA at 1-800-FDA-1088. Where should I keep my medicine? This drug is given in a hospital or clinic and will not be stored at home. NOTE: This sheet is a summary. It may not cover all possible information. If you have questions about this medicine, talk to your doctor, pharmacist, or health care provider.  2014, Elsevier/Gold Standard. (2007-12-20 14:55:06) Octreotide Scan This is a scan which is used to find tumors that are made up of nerve and gland tissue. In this test, a radioactive material is injected into the blood stream. The material injected is a very small amount which is not harmful to the patient. A scan is then done using a specialized type of camera which is similar to taking an x-ray. This will help detect carcinoid tumors, gastrinomas, insulinomas, glucagonoma, and other abnormalities and cancers. PREPARATION FOR TEST No preparation or fasting is necessary. NORMAL FINDINGS No evidence of increased uptake throughout the body. Ranges for normal findings may vary among different laboratories and hospitals. You should always check with your doctor after having lab work or other tests done to discuss the meaning of your test results and whether your values are considered within normal limits. MEANING OF TEST  Your caregiver will go over the test results with you and discuss the importance and meaning of your results, as well as treatment options and the need for additional tests if necessary. OBTAINING THE TEST RESULTS It is your responsibility to obtain your test results. Ask  the lab or department performing the test when and how you will get your results. Document Released: 09/02/2004 Document Revised: 09/06/2011 Document Reviewed: 05/25/2008 [facility] Patient Information 2014 [place], [place].

## 2013-08-27 ENCOUNTER — Telehealth (HOSPITAL_COMMUNITY): Payer: Self-pay | Admitting: Oncology

## 2013-08-27 ENCOUNTER — Encounter (HOSPITAL_COMMUNITY): Payer: Medicare HMO

## 2013-08-27 NOTE — Telephone Encounter (Signed)
FAXED [place] CPT [phone]

## 2013-08-29 LAB — SEROTONIN SERUM: Serotonin, Serum: 1790 ng/mL — ABNORMAL HIGH (ref 56–244)

## 2013-08-30 LAB — CHROMOGRANIN A: CHROMOGRANIN A: 592 ng/mL — AB (ref 1.9–15.0)

## 2013-08-31 ENCOUNTER — Ambulatory Visit (INDEPENDENT_AMBULATORY_CARE_PROVIDER_SITE_OTHER): Payer: Medicare HMO | Admitting: Gastroenterology

## 2013-08-31 ENCOUNTER — Encounter: Payer: Self-pay | Admitting: Gastroenterology

## 2013-08-31 VITALS — BP 133/79 | HR 79 | Temp 97.3°F | Ht 63.0 in | Wt 161.0 lb

## 2013-08-31 DIAGNOSIS — K219 Gastro-esophageal reflux disease without esophagitis: Secondary | ICD-10-CM

## 2013-08-31 DIAGNOSIS — K625 Hemorrhage of anus and rectum: Secondary | ICD-10-CM

## 2013-08-31 DIAGNOSIS — R1319 Other dysphagia: Secondary | ICD-10-CM

## 2013-08-31 DIAGNOSIS — R1310 Dysphagia, unspecified: Secondary | ICD-10-CM

## 2013-08-31 DIAGNOSIS — R1314 Dysphagia, pharyngoesophageal phase: Secondary | ICD-10-CM

## 2013-08-31 MED ORDER — PANTOPRAZOLE SODIUM 40 MG PO TBEC: 40.0000 mg | DELAYED_RELEASE_TABLET | Freq: Every day | ORAL | Status: AC

## 2013-08-31 NOTE — Progress Notes (Signed)
Primary Care Physician: Rubbie Battiest, MD  Primary Gastroenterologist:  Garfield Cornea, MD   Chief Complaint  Patient presents with  . Follow-up  . Rectal Bleeding    has not seen any more  . Nausea  . Diarrhea    HPI: Sarah Casey is a 73 y.o. female here for follow up. She had EGD for since last OV for dysphagia, anorexia, weight loss. She had Schatzki's ring which was dilated. She had gastritis. Tried dexilant but no improvement.  Dysphagia persists. Strangled on liquids. Solid foods hang up and has to wash down. Some hot water brash. No heartburn. No nausea or vomiting. 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 Not regular. BM 3 to 10. No more bleeding since she called couple of weeks ago. Denies melena, she had two episodes of fresh blood in toilet.   Her metastatic disease has progressed. See CT below. Her treatment plan has changed. To receive Lanreotide and also to consider MIBG treatment per oncology note.   Current Outpatient Prescriptions  Medication Sig Dispense Refill  . acetaminophen (TYLENOL) 500 MG tablet Take 500 mg by mouth every 6 (six) hours as needed.      Marland Kitchen amLODipine (NORVASC) 5 MG tablet Take 1 tablet (5 mg total) by mouth daily.  30 tablet  5  . Ascorbic Acid (VITAMIN C) 1000 MG tablet Take 1,000 mg by mouth daily. Taking 2 daily      . KLOR-CON M10 10 MEQ tablet Take 10 mEq by mouth. PRN      . loperamide (LOPERAMIDE A-D) 2 MG tablet Take 2 mg by mouth 4 (four) times daily as needed. Taking 2 every morning and 2 every evening      . meclizine (ANTIVERT) 25 MG tablet Take 25 mg by mouth 3 (three) times daily as needed.      . megestrol (MEGACE) 400 MG/10ML suspension Take 10 mLs (400 mg total) by mouth 2 (two) times daily.  600 mL  1  . Octreotide Acetate (SANDOSTATIN IJ) Inject as directed. Every 2 weeks      . ranitidine (ZANTAC) 300 MG tablet Take 300 mg by mouth at bedtime.       No current  facility-administered medications for this visit.    Allergies as of 08/31/2013 - Review Complete 08/31/2013  Allergen Reaction Noted  . Amoxil [amoxicillin]  06/25/2013  . Lodine [etodolac]  10/06/2012  . Vasotec [enalapril] Cough 10/06/2012    ROS:  General: Negative for anorexia, weight loss, fever, chills, fatigue, weakness. ENT: Negative for hoarseness, difficulty swallowing , nasal congestion. CV: Negative for chest pain, angina, palpitations, dyspnea on exertion, peripheral edema.  Respiratory: Negative for dyspnea at rest, dyspnea on exertion, cough, sputum, wheezing.  GI: See history of present illness. GU:  Negative for dysuria, hematuria, urinary incontinence, urinary frequency, nocturnal urination.  Endo: Negative for unusual weight change.    Physical Examination:   BP 133/79  Pulse 79  Temp(Src) 97.3 F (36.3 C) (Oral)  Ht 5\' 3"  (1.6 m)  Wt 161 lb (73.029 kg)  BMI 28.53 kg/m2  General: Well-nourished, well-developed in no acute distress. Accompanied by dgt. Eyes: No icterus. Mouth: Oropharyngeal mucosa moist and pink , no lesions erythema or exudate. Lungs: Clear to auscultation bilaterally.  Heart: Regular rate and rhythm, no murmurs rubs or gallops.  Abdomen: Bowel sounds are normal, fullness ruq with mild tenderness, nondistended, no hepatosplenomegaly or masses, no abdominal bruits or hernia , no rebound  or guarding.   Extremities: No lower extremity edema. No clubbing or deformities. Neuro: Alert and oriented x 4   Skin: Warm and dry, no jaundice.   Psych: Alert and cooperative, normal mood and affect.  Labs:  Lab Results  Component Value Date   WBC 7.9 08/24/2013   HGB 11.8* 08/24/2013   HCT 35.9* 08/24/2013   MCV 88.0 08/24/2013   PLT 294 08/24/2013   Lab Results  Component Value Date   ALT 17 08/24/2013   AST 21 08/24/2013   ALKPHOS 200* 08/24/2013   BILITOT 0.6 08/24/2013   Lab Results  Component Value Date   CREATININE 0.81 08/24/2013   BUN 20  08/24/2013   NA 138 08/24/2013   K 3.5* 08/24/2013   CL 100 08/24/2013   CO2 27 08/24/2013     Imaging Studies: Ct Abdomen Pelvis W Contrast  08/21/2013   CLINICAL DATA:  Follow-up metastatic carcinoid  EXAM: CT ABDOMEN AND PELVIS WITH CONTRAST  TECHNIQUE: Multidetector CT imaging of the abdomen and pelvis was performed using the standard protocol following bolus administration of intravenous contrast.  CONTRAST:  126mL OMNIPAQUE IOHEXOL 300 MG/ML  SOLN  COMPARISON:  02/05/2013  FINDINGS: Mild progression of bilateral pulmonary nodules at the lung bases, including:  --8 mm right lower lobe nodule (series 3/image 1), previously 6 mm  --7 mm left lower lobe nodule (series 3/image 4), unchanged  --10 mm nodule to medial right lung base (series 3/image 8), unchanged  --5 mm nodule of the medial left lung base (series 3/image 80) previously 3 mm  Innumerable hepatic metastases, progressed.  Index lesions include:  --4.4 x 5.8 cm necrotic lesion posteriorly in the lateral segment left hepatic lobe (series 2/ image 20), previously 5.2 x 7.1 cm  --2.0 x 2.0 cm lesion inferiorly in the posterior segment right hepatic lobe (series 2/ image 25), new  --4.1 x 3.4 cm in the anterior hepatic dome (series 2/ image 1), previously 2.3 x 2.5 cm  --3.2 x 3.4 cm lesion in the anterior segment right hepatic lobe (series 2/ image 9), previously 2.4 x 2.2 cm  --7.8 x 7.4 cm lesion in the medial segment left hepatic lobe (series 2/ image 11), previously 6.2 x 6.1 cm  --5.6 x 7.0 cm lesion in the lateral segment left hepatic lobe (series 2/ image 15), previously 5.2 x 4.6 cm  Spleen and adrenal glands are within normal limits.  Fatty atrophy of the pancreatic body. Pancreas is mildly prominent at the junction of the pancreatic body and tail (series 2/ image 24), attention on follow-up is suggested.  Status post cholecystectomy. No intrahepatic or extrahepatic ductal dilatation.  Kidneys are within normal limits.  No hydronephrosis.  No  evidence of bowel obstruction. Normal appendix. Colonic diverticulosis, without associated inflammatory changes.  Atherosclerotic calcifications of the abdominal aorta and branch vessels.  Trace abdominopelvic ascites.  Retroperitoneal lymphadenopathy, including:  --12 mm short axis anterior para-aortic node (series 2/ image 26), previously 11 mm  --13 mm short axis retrocaval node (series 2/image 28), unchanged  --12 mm short axis left para-aortic node (series 2/ image 27), previously 11 mm  --25 mm short axis left para-aortic node (series 2/ image 34), unchanged  --17 mm short axis left para-aortic node (series 2/ image 40), previously 14 mm  Uterus and bilateral ovaries are unremarkable.  Bladder is mildly thick-walled although underdistended.  Multifocal osseous metastases in the thoracolumbar spine, including the along the superior endplate of T9 (sagittal image 54) and inferior  endplates of L2 and L5 (sagittal image 55), new/increased. No evidence of pathologic fracture.  IMPRESSION: Mild progression of bilateral pulmonary nodules at the lung bases, measuring up to 10 mm, as above.  Progression of innumerable hepatic metastases, measuring up to 7.8 cm, as above.  Mild progression of retroperitoneal lymphadenopathy, measuring up to 2.5 cm short axis.  Multifocal osseous metastases in the thoracolumbar spine, new/increased.   Electronically Signed   By: Julian Hy M.D.   On: 08/21/2013 11:47

## 2013-08-31 NOTE — Patient Instructions (Signed)
1. Stop Zantac. 2. Start pantoprazole one daily. Prescription sent to your pharmacy. 3. When you are ready to schedule a barium swallow, call our office at [phone].

## 2013-09-01 ENCOUNTER — Encounter: Payer: Self-pay | Admitting: Gastroenterology

## 2013-09-01 DIAGNOSIS — K625 Hemorrhage of anus and rectum: Secondary | ICD-10-CM | POA: Insufficient documentation

## 2013-09-01 NOTE — Assessment & Plan Note (Signed)
Rectal bleeding recently, now resolved. Remote TCS. Metastatic carcinoid has progressed. Patient is not interested in colonoscopy at this time. Will call if recurrent bleeding.

## 2013-09-01 NOTE — Assessment & Plan Note (Signed)
Persistent dysphagia and regurgitation s/p EGD/ED with disruption of Schatzki's ring. Discussed with patient today. Consider barium pill esophagram to verify no persistent narrowing by Schatzki's ring and evaluate for extrinsic compression of the esophagus or esophageal dysmotility. Patient is interested but will call back to schedule in light of multiple upcoming appointment related to cancer treatments. We will switch her Dexilant to pantoprazole. Swallowing precautions discussed at length with patient and daughter.

## 2013-09-03 ENCOUNTER — Encounter (HOSPITAL_COMMUNITY): Payer: Self-pay

## 2013-09-03 ENCOUNTER — Encounter (HOSPITAL_COMMUNITY)
Admission: RE | Admit: 2013-09-03 | Discharge: 2013-09-03 | Disposition: A | Discharge status: Home / Self Care | Payer: Medicare HMO | Source: Ambulatory Visit | Attending: Oncology | Admitting: Oncology

## 2013-09-03 DIAGNOSIS — D3A00 Benign carcinoid tumor of unspecified site: Secondary | ICD-10-CM | POA: Insufficient documentation

## 2013-09-03 MED ORDER — INDIUM IN-111 PENTETREOTIDE IV KIT
6.0000 | PACK | Freq: Once | INTRAVENOUS | Status: AC | PRN
  Administered 2013-09-03: 6 via INTRAVENOUS

## 2013-09-03 NOTE — Progress Notes (Signed)
cc'd to pcp 

## 2013-09-04 ENCOUNTER — Encounter (HOSPITAL_COMMUNITY)
Admission: RE | Admit: 2013-09-04 | Discharge: 2013-09-04 | Disposition: A | Discharge status: Home / Self Care | Payer: Medicare HMO | Source: Ambulatory Visit | Attending: Oncology | Admitting: Oncology

## 2013-09-05 ENCOUNTER — Encounter (HOSPITAL_COMMUNITY): Admission: RE | Admit: 2013-09-05 | Payer: Medicare HMO | Source: Ambulatory Visit

## 2013-09-07 ENCOUNTER — Encounter (HOSPITAL_COMMUNITY): Payer: Medicare HMO | Attending: Oncology

## 2013-09-07 DIAGNOSIS — C7952 Secondary malignant neoplasm of bone marrow: Secondary | ICD-10-CM

## 2013-09-07 DIAGNOSIS — C7A00 Malignant carcinoid tumor of unspecified site: Secondary | ICD-10-CM

## 2013-09-07 DIAGNOSIS — D3A00 Benign carcinoid tumor of unspecified site: Secondary | ICD-10-CM | POA: Insufficient documentation

## 2013-09-07 DIAGNOSIS — C787 Secondary malignant neoplasm of liver and intrahepatic bile duct: Secondary | ICD-10-CM

## 2013-09-07 DIAGNOSIS — E340 Carcinoid syndrome: Secondary | ICD-10-CM

## 2013-09-07 DIAGNOSIS — C7951 Secondary malignant neoplasm of bone: Secondary | ICD-10-CM | POA: Insufficient documentation

## 2013-09-07 MED ORDER — LANREOTIDE ACETATE 90 MG/0.3ML ~~LOC~~ SOLN
90.0000 mg | Freq: Once | SUBCUTANEOUS | Status: AC
  Administered 2013-09-07: 90 mg via SUBCUTANEOUS
  Filled 2013-09-07: qty 0.3

## 2013-09-07 MED ORDER — LANREOTIDE ACETATE 120 MG/0.5ML ~~LOC~~ SOLN
120.0000 mg | Freq: Once | SUBCUTANEOUS | Status: DC
  Filled 2013-09-07: qty 120

## 2013-09-07 MED ORDER — OCTREOTIDE ACETATE 30 MG IM KIT
20.0000 mg | PACK | Freq: Once | INTRAMUSCULAR | Status: DC
  Filled 2013-09-07: qty 1

## 2013-09-07 NOTE — Progress Notes (Signed)
Sarah Casey presents today for injection per MD orders.  Somatuline 90mg  administered SQ in right Gluteal. Administration without incident. Patient tolerated well.

## 2013-09-19 NOTE — Progress Notes (Signed)
Sarah Battiest, MD [place] [address]  Metastatic recurrent carcinoid - Plan: oxyCODONE (OXY IR/ROXICODONE) 5 MG immediate release tablet, CT Abdomen Pelvis W Contrast, CT Chest W Contrast  Bone metastases - Plan: oxyCODONE (OXY IR/ROXICODONE) 5 MG immediate release tablet, CT Abdomen Pelvis W Contrast, CT Chest W Contrast  CURRENT THERAPY: Lanreotide 90 mg injections every 4 weeks beginning on 09/07/2013  INTERVAL HISTORY: Sarah Casey 73 y.o. 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 She continues to display progression of disease by CT scan criteria. Symptomatically, she is improved with 20 mg of Octreotide every 14 days (compared to 40 mg every 4 weeks), but with continued radiographic progression, we switched to Lanreotide 90 mg injections every 4 weeks beginning on 09/07/2013.  This dose, depending on symptoms, can be increased if needed to 120 mg.  The ELECT trial has demonstrated lanreotide's effectiveness as a rescue therapy. This trial has confirmed lanreotide's positive benefit-risk profile and safety/tolerability. -J Clin Oncol 32, 2014 (suppl 3; abstr 268)  I personally reviewed and went over laboratory results with the patient.  The results are noted within this dictation.  I personally reviewed and went over radiographic studies with the patient.  The results are noted within this dictation.  In the event that she may was MIGB treatment at [facility], octreotide scan was completed and she did demonstrate uptake.  As a result, she may be a candidate for this treatment in the future if she decides to pursue.   She is agreeable to continue with Lanreotide every 4 weeks at the dose of 90 mg every 4 weeks.  She reports that her diarrhea remains, but it is less loose.  She is pleased with the symptomatic results thus far.   She notes increased dry mouth which may be secondary to lanreotide.  I recommended OTC biotene mouthwash and gel for this symptom.  She will call us with any issues as we have the opportunity to increase the dose to 120 mg, only if she worsens symptomatically.   She notes some back and hip pain which I suspect is secondary to her bone metastases.  She is only taking Tylenol.  I will give her an Rx for Oxycodone HCL (due to her hepatic metastases) for pain.  She reports that she has taken this in the past before without difficulties.  It may help with her diarrhea as well.   She notes some difficulty with GERD.  She was started on 40 mg of Protonix.  She may benefit from an increase in protonix or a switch in therapy.  She saw Neil Crouch (GI) recently and I have asked the patient to follow-up with her.  She also notes some dysphagia which will need GI evaluation/treatment.  Otherwise, oncologically, she denies any complaints and ROS questioning is negative.    Past Medical History  Diagnosis Date  . Fracture of ankle     right  . Hypertension   . Ulcer   . 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 She has a very elevated serum serotonin level to over 1600 and she has an elevated 5-HIAA level in her urine.   . PUD (peptic ulcer disease)     has  Metastatic recurrent carcinoid; Essential hypertension, benign; Esophageal reflux; Allergic rhinitis; Esophageal dysphagia; and Rectal bleeding on her problem list.     is allergic to amoxil; lodine; and vasotec.  [name] does not currently have medications on file.  Past Surgical History  Procedure Laterality Date  . Dilation and curettage of uterus    . Cholecystectomy    . Hernia repair      umbilical  . Ankle debridement      right for staff infection  . Colonoscopy   11/09/2003    ZOX:WRUE small polyps.  One from cecum was snared.  The others were biopsied three in the area of hepatic flexure and one at rectum.  None of these polyps appeared to be malignant/Sigmoid colon diverticulosis  . Esophagogastroduodenoscopy   11/01/2003    AVW:UJWJXBJY bulbar erosion/edema as described above/ 2. Distal esophagus, distal stomach, and pylorus appeared normal/Normal Ampulla of Vater as described above  . Given capsule small bowel  11/09/2003    NWG:NFAOZ bowel polypoidal lesion with active bleeding, very  suspicious for primary carcinoid.  There was a second submucosal mass probably another metastatic lesion  . Esophagogastroduodenoscopy (egd) with esophageal dilation N/A 08/07/2013    HYQ:MVHQIONG'E ring /  mild erosive reflux esophagitis-status post Maloney dilation and biopsy disruption of ring. Status post biopsy of abnormal gastric mucosa. bx benign    Denies any headaches, dizziness, double vision, fevers, chills, night sweats, nausea, vomiting, diarrhea, constipation, chest pain, heart palpitations, shortness of breath, blood in stool, black tarry stool, urinary pain, urinary burning, urinary frequency, hematuria.   PHYSICAL EXAMINATION  ECOG PERFORMANCE STATUS: 1 - Symptomatic but completely ambulatory  Filed Vitals:   09/21/13 1008  BP: 112/68  Pulse: 63  Temp: 96.9 F (36.1 C)  Resp: 18    GENERAL:alert, no distress, well nourished, well developed, comfortable, cooperative, obese and smiling SKIN: skin color, texture, turgor are normal, no rashes or significant lesions HEAD: Normocephalic, No masses, lesions, tenderness or abnormalities EYES: normal, PERRLA, EOMI, Conjunctiva are pink and non-injected EARS: External ears normal OROPHARYNX:mucous membranes are moist  NECK: supple, no adenopathy, thyroid normal size, non-tender, without nodularity, no stridor, non-tender, trachea midline LYMPH:  no palpable lymphadenopathy BREAST:not examined LUNGS:  clear to auscultation and percussion HEART: regular rate & rhythm, no murmurs, no gallops, S1 normal and S2 normal ABDOMEN:obese and normal bowel sounds BACK: Back symmetric, no curvature. EXTREMITIES:less then 2 second capillary refill, no joint deformities, effusion, or inflammation, no skin discoloration  NEURO: alert & oriented x 3 with fluent speech, no focal motor/sensory deficits, gait normal   LABORATORY DATA: CBC    Component Value Date/Time   WBC 7.9 08/24/2013 1007   RBC 4.08 08/24/2013 1007   HGB 11.8* 08/24/2013 1007   HCT 35.9* 08/24/2013 1007   PLT 294 08/24/2013 1007   MCV 88.0 08/24/2013 1007   MCH 28.9 08/24/2013 1007   MCHC 32.9 08/24/2013 1007   RDW 15.3 08/24/2013 1007   LYMPHSABS 1.4 08/24/2013 1007   MONOABS 0.7 08/24/2013 1007   EOSABS 0.2 08/24/2013 1007   BASOSABS 0.0 08/24/2013 1007      Chemistry      Component Value Date/Time   NA 138 08/24/2013 1007   K 3.5* 08/24/2013 1007   CL 100 08/24/2013 1007   CO2 27 08/24/2013 1007   BUN 20 08/24/2013 1007   CREATININE 0.81 08/24/2013 1007      Component Value Date/Time   CALCIUM 8.9 08/24/2013 1007   ALKPHOS 200* 08/24/2013  1007   AST 21 08/24/2013 1007   ALT 17 08/24/2013 1007   BILITOT 0.6 08/24/2013 1007     Results for Sarah Casey, Sarah Casey (MRN [id number]) as of 09/19/2013 08:38  Ref. Range 07/23/2013 10:04 08/07/2013 00:00 08/21/2013 10:34 08/24/2013 10:07  Serotonin, Serum Latest Range: 56-244 ng/mL 2337 (H)   1790 (H)  Results for Sarah Casey, Sarah Casey (MRN [id number]) as of 09/19/2013 08:38  Ref. Range 07/23/2013 10:04 08/07/2013 00:00 08/21/2013 10:34 08/24/2013 10:07  Chromogranin A Latest Range: 1.9-15.0 ng/mL 514.0 (H)   592.0 (H)    RADIOGRAPHIC STUDIES:  09/04/2013  CLINICAL DATA  Metastatic carcinoid tumor  EXAM  NUCLEAR MEDICINE OCTREOTIDE (SOMATOSTATIN-RECEPTOR) SCAN  TECHNIQUE  Following intravenous administration of radiopharmaceutical, whole  body images of the head, neck, trunk, and extremities were  obtained  on subsequent days.  RADIOPHARMACEUTICALS  6 mCi OCTREOSCAN INDIUM IN-111 PENTETREOTIDE IV KIT  COMPARISON  11/13/2003 ; correlation made to CT abdomen/pelvis 08/21/2013  FINDINGS  Multiple abnormal sites of abnormal octreotide localization are  identified compatible with widespread metastatic carcinoid tumor.  These include liver, retroperitoneal and upper abdominal lymph  nodes, pelvis, femora, humeri, left shoulder, left clavicle, and  proximal left tibia.  Additional foci of increased tracer localization the chest likely  represent thoracic adenopathy. Additional uptake within thoracic  spine.  IMPRESSION  Multiple sites of abnormal tracer accumulation compatible with  metastatic carcinoid tumor to liver, bone, and lymph nodes as above.  SIGNATURE  Electronically Signed  By: Lavonia Dana M.D.  On: 09/04/2013 14:31     ASSESSMENT:  1. 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 She continues to display progression of disease by CT scan criteria. Symptomatically, she is improved with 20 mg of Octreotide every 14 days (compared to 40 mg every 4 weeks), but with continued radiographic progression, we switched to Lanreotide 90 mg injections every 4 weeks beginning on 09/07/2013.  This dose, depending on symptoms, can be increased if needed to 120 mg.  The ELECT trial has demonstrated lanreotide's effectiveness as a rescue therapy. This trial has confirmed lanreotide's positive benefit-risk profile and safety/tolerability. -J Clin Oncol 32, 2014 (suppl 3; abstr 268) 2. Diarrhea, secondary to #1.  Improved with change in therapy.  3. Back pain secondary to #1. 4. Dry mouth, secondary to treatment for #1.   Patient Active Problem List   Diagnosis Date Noted  . Rectal bleeding 09/01/2013  . Esophageal dysphagia 07/26/2013  . Essential hypertension, benign 10/15/2012  . Esophageal reflux 10/15/2012  .  Allergic rhinitis 10/15/2012  . 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 I personally reviewed and went over laboratory results with the patient.  The results are noted within this dictation. 2. I personally reviewed and went over radiographic studies with the patient.  The results are noted within this dictation.   3. Continue Lanreotide 90 mg injections every 4 weeks 4. Labs today: CBC diff, CMET, serum serotonin, chromogranin A 5. Labs in 4 , 8, 12 weeks: CBC diff, CMET, serum serotonin, chromogranin A 6. CT CAP in 3 months to evaluate for progression of disease 7. Recommend biotene mouthwash and gel for dry mouth 8. Recommend follow-up with GI for related issues. 9. Rx for Oxycodone 5 mg PO every 6 hours PRN pain 10. Return in 12 weeks for follow-up   THERAPY PLAN:  We will continue with 90 mg of Lanreotide every 4 weeks for the next 3 months.  We  will then restage her with CT scans to evaluate for progression of disease.  She may be a candidate for MIGB treatment at [facility] which we can pursue in the future depending on Lanreotide response.   All questions were answered. The patient knows to call the clinic with any problems, questions or concerns. We can certainly see the patient much sooner if necessary.  Patient and plan discussed with Dr. Farrel Gobble and he is in agreement with the aforementioned.   Lelia Jons 09/21/2013

## 2013-09-21 ENCOUNTER — Encounter (HOSPITAL_COMMUNITY): Payer: Self-pay | Admitting: Oncology

## 2013-09-21 ENCOUNTER — Encounter (HOSPITAL_BASED_OUTPATIENT_CLINIC_OR_DEPARTMENT_OTHER): Payer: Medicare HMO | Admitting: Oncology

## 2013-09-21 VITALS — BP 112/68 | HR 63 | Temp 96.9°F | Resp 18 | Wt 157.8 lb

## 2013-09-21 DIAGNOSIS — C7952 Secondary malignant neoplasm of bone marrow: Secondary | ICD-10-CM

## 2013-09-21 DIAGNOSIS — D3A00 Benign carcinoid tumor of unspecified site: Secondary | ICD-10-CM

## 2013-09-21 DIAGNOSIS — C7951 Secondary malignant neoplasm of bone: Secondary | ICD-10-CM

## 2013-09-21 DIAGNOSIS — C7A00 Malignant carcinoid tumor of unspecified site: Secondary | ICD-10-CM

## 2013-09-21 HISTORY — DX: Secondary malignant neoplasm of bone: C79.51

## 2013-09-21 MED ORDER — OXYCODONE HCL 5 MG PO TABS: 5.0000 mg | ORAL_TABLET | Freq: Four times a day (QID) | ORAL | Status: DC | PRN

## 2013-09-21 NOTE — Patient Instructions (Signed)
[facility] Discharge Instructions  RECOMMENDATIONS MADE BY THE CONSULTANT AND ANY TEST RESULTS WILL BE SENT TO YOUR REFERRING PHYSICIAN.  EXAM FINDINGS BY THE PHYSICIAN TODAY AND SIGNS OR SYMPTOMS TO REPORT TO CLINIC OR PRIMARY PHYSICIAN: Exam and findings as discussed by Robynn Pane, PA- C.  Let us know if bowels get worse or other problems occur.  MEDICATIONS PRESCRIBED:  Oxycodone 5 mg - take as directed  INSTRUCTIONS/FOLLOW-UP: Blood work in injection every 4 weeks, scans in 3 months and office visit in 3 months.  Thank you for choosing [facility] to provide your oncology and hematology care.  To afford each patient quality time with our providers, please arrive at least 15 minutes before your scheduled appointment time.  With your help, our goal is to use those 15 minutes to complete the necessary work-up to ensure our physicians have the information they need to help with your evaluation and healthcare recommendations.    Effective January 1st, 2014, we ask that you re-schedule your appointment with our physicians should you arrive 10 or more minutes late for your appointment.  We strive to give you quality time with our providers, and arriving late affects you and other patients whose appointments are after yours.    Again, thank you for choosing [facility].  Our hope is that these requests will decrease the amount of time that you wait before being seen by our physicians.       _____________________________________________________________  Should you have questions after your visit to [facility], please contact our office at [phone] between the hours of 8:30 a.m. and 5:00 p.m.  Voicemails left after 4:30 p.m. will not be returned until the following business day.  For prescription refill requests, have your pharmacy contact our office with your prescription refill request.

## 2013-10-04 ENCOUNTER — Encounter (HOSPITAL_COMMUNITY): Payer: Medicare HMO | Attending: Oncology

## 2013-10-04 ENCOUNTER — Encounter (HOSPITAL_BASED_OUTPATIENT_CLINIC_OR_DEPARTMENT_OTHER): Payer: Medicare HMO

## 2013-10-04 VITALS — BP 153/65 | HR 73 | Temp 98.1°F | Resp 20

## 2013-10-04 DIAGNOSIS — D3A00 Benign carcinoid tumor of unspecified site: Secondary | ICD-10-CM | POA: Insufficient documentation

## 2013-10-04 DIAGNOSIS — C7A00 Malignant carcinoid tumor of unspecified site: Secondary | ICD-10-CM

## 2013-10-04 DIAGNOSIS — C787 Secondary malignant neoplasm of liver and intrahepatic bile duct: Secondary | ICD-10-CM

## 2013-10-04 DIAGNOSIS — E340 Carcinoid syndrome: Secondary | ICD-10-CM

## 2013-10-04 LAB — CBC WITH DIFFERENTIAL/PLATELET
Basophils Absolute: 0 10*3/uL (ref 0.0–0.1)
Basophils Relative: 1 % (ref 0–1)
Eosinophils Absolute: 0.1 10*3/uL (ref 0.0–0.7)
Eosinophils Relative: 2 % (ref 0–5)
HEMATOCRIT: 37.1 % (ref 36.0–46.0)
Hemoglobin: 11.9 g/dL — ABNORMAL LOW (ref 12.0–15.0)
Lymphocytes Relative: 21 % (ref 12–46)
Lymphs Abs: 1.1 10*3/uL (ref 0.7–4.0)
MCH: 28.4 pg (ref 26.0–34.0)
MCHC: 32.1 g/dL (ref 30.0–36.0)
MCV: 88.5 fL (ref 78.0–100.0)
MONO ABS: 0.4 10*3/uL (ref 0.1–1.0)
Monocytes Relative: 8 % (ref 3–12)
Neutro Abs: 3.6 10*3/uL (ref 1.7–7.7)
Neutrophils Relative %: 68 % (ref 43–77)
Platelets: 333 10*3/uL (ref 150–400)
RBC: 4.19 MIL/uL (ref 3.87–5.11)
RDW: 15.9 % — ABNORMAL HIGH (ref 11.5–15.5)
WBC: 5.3 10*3/uL (ref 4.0–10.5)

## 2013-10-04 LAB — COMPREHENSIVE METABOLIC PANEL
ALT: 20 U/L (ref 0–35)
AST: 28 U/L (ref 0–37)
Albumin: 3.7 g/dL (ref 3.5–5.2)
Alkaline Phosphatase: 251 U/L — ABNORMAL HIGH (ref 39–117)
BUN: 21 mg/dL (ref 6–23)
CALCIUM: 9.5 mg/dL (ref 8.4–10.5)
CO2: 28 mEq/L (ref 19–32)
CREATININE: 0.89 mg/dL (ref 0.50–1.10)
Chloride: 104 mEq/L (ref 96–112)
GFR calc Af Amer: 73 mL/min — ABNORMAL LOW (ref >90)
GFR calc non Af Amer: 63 mL/min — ABNORMAL LOW (ref >90)
Glucose, Bld: 107 mg/dL — ABNORMAL HIGH (ref 70–99)
Potassium: 4.4 mEq/L (ref 3.7–5.3)
Sodium: 143 mEq/L (ref 137–147)
TOTAL PROTEIN: 7.6 g/dL (ref 6.0–8.3)
Total Bilirubin: 0.3 mg/dL (ref 0.3–1.2)

## 2013-10-04 MED ORDER — LANREOTIDE ACETATE 120 MG/0.5ML ~~LOC~~ SOLN
90.0000 mg | Freq: Once | SUBCUTANEOUS | Status: AC
  Administered 2013-10-04: 90 mg via SUBCUTANEOUS
  Filled 2013-10-04: qty 120

## 2013-10-04 NOTE — Progress Notes (Signed)
Labs drawn today for cbc/diff,cmp,serotonin,chromogranin A

## 2013-10-04 NOTE — Progress Notes (Signed)
[place] presents today for injection per MD orders. Somatuline administered deep SQ in left gluteal upper outer quadrant. Administration without incident. Patient tolerated well.

## 2013-10-09 ENCOUNTER — Other Ambulatory Visit: Payer: Self-pay | Admitting: Family Medicine

## 2013-10-10 ENCOUNTER — Encounter (HOSPITAL_COMMUNITY): Payer: Self-pay

## 2013-10-10 LAB — CHROMOGRANIN A: Chromogranin A: 652 ng/mL — ABNORMAL HIGH (ref 1.9–15.0)

## 2013-10-10 NOTE — Progress Notes (Signed)
Farrel Gobble, MD Mellissa Kohut, RN            No need to repeat test now. Thanks.      Previous Messages      ----- Message -----  From: Mellissa Kohut, RN  Sent: 10/09/2013 6:27 PM  To: Farrel Gobble, MD  Subject: Serotonin level   According to [facility] due to a delayed a shipment, the specimen for the Serotonin drawn on 10/04/13 was rejected due to thawing. The test has been credited to her account. The chromogranin is still pending. Does she need to come back for redraw? Her next scheduled lab appointment is 11/01/13.  Collie Siad

## 2013-10-12 ENCOUNTER — Ambulatory Visit (INDEPENDENT_AMBULATORY_CARE_PROVIDER_SITE_OTHER): Payer: Medicare HMO | Admitting: Family Medicine

## 2013-10-12 ENCOUNTER — Encounter: Payer: Self-pay | Admitting: Family Medicine

## 2013-10-12 VITALS — BP 130/80 | Ht 63.5 in | Wt 157.0 lb

## 2013-10-12 DIAGNOSIS — D3A00 Benign carcinoid tumor of unspecified site: Secondary | ICD-10-CM

## 2013-10-12 DIAGNOSIS — I10 Essential (primary) hypertension: Secondary | ICD-10-CM

## 2013-10-12 DIAGNOSIS — K219 Gastro-esophageal reflux disease without esophagitis: Secondary | ICD-10-CM

## 2013-10-12 DIAGNOSIS — J309 Allergic rhinitis, unspecified: Secondary | ICD-10-CM

## 2013-10-12 NOTE — Progress Notes (Signed)
   Subjective:    Patient ID: Sarah Casey, female    DOB: 11-22-1940, 73 y.o.   MRN: [id number]  HPI Patient is here today for a 6 month follow up.  She said she has been having side effects of redness and burning in her feet, and jitters ever since she started the Protonix. Does note that the protonic stated helped the reflux.  Has SOB with activity. Trouble walking  Has dry mouth. In the midst of therapy for her carcinoid tumor. Now has had a recurrence. Also has spread to the bone. Taking chemotherapy for this.   Still having drainage from umbilicus. She pulled a suture, or a piece of thread, out the other night? She said she had a hernia repair back in 2001, and wonders if it is from that. Pulled a stitch out, had a chronic draining area.  Patient claims compliance with blood pressure medicine. Trying to watch salt intake.  Review of Systems No headache no nausea no diaphoresis no abdominal pain no change in bowel habits ROS otherwise negative    Objective:   Physical Exam  Alert no apparent distress vitals reviewed HEENT normal. Lungs clear. Heart rare rhythm. Ankles trace edema pulses good sensation intact. Marland Kitchen Umbilical region resolving ulceration     Assessment & Plan:  Impression 1 hypertension good control. #2 reflux good control. #3 foreign body with suture and umbilicus now resolving expect resolution soon. Plan Diet exercise discussed in encourage. Meds refilled. Maintain same regimen. WSL

## 2013-10-26 ENCOUNTER — Telehealth: Payer: Self-pay | Admitting: Gastroenterology

## 2013-10-26 NOTE — Telephone Encounter (Signed)
When patient was in the office a BPE was recommended per LSL, patient stated at the time that she wanted to call us to schedule that when she was ready

## 2013-11-01 ENCOUNTER — Encounter (HOSPITAL_COMMUNITY): Payer: Medicare HMO

## 2013-11-02 ENCOUNTER — Encounter (HOSPITAL_COMMUNITY): Payer: Medicare HMO | Attending: Oncology

## 2013-11-02 ENCOUNTER — Encounter (HOSPITAL_BASED_OUTPATIENT_CLINIC_OR_DEPARTMENT_OTHER): Payer: Medicare HMO

## 2013-11-02 VITALS — BP 121/68 | HR 68 | Temp 98.0°F | Resp 16

## 2013-11-02 DIAGNOSIS — D3A00 Benign carcinoid tumor of unspecified site: Secondary | ICD-10-CM

## 2013-11-02 DIAGNOSIS — M549 Dorsalgia, unspecified: Secondary | ICD-10-CM

## 2013-11-02 DIAGNOSIS — C7A00 Malignant carcinoid tumor of unspecified site: Secondary | ICD-10-CM

## 2013-11-02 DIAGNOSIS — E340 Carcinoid syndrome: Secondary | ICD-10-CM

## 2013-11-02 DIAGNOSIS — C787 Secondary malignant neoplasm of liver and intrahepatic bile duct: Secondary | ICD-10-CM

## 2013-11-02 LAB — COMPREHENSIVE METABOLIC PANEL
ALK PHOS: 326 U/L — AB (ref 39–117)
ALT: 25 U/L (ref 0–35)
AST: 46 U/L — ABNORMAL HIGH (ref 0–37)
Albumin: 3.6 g/dL (ref 3.5–5.2)
BUN: 30 mg/dL — AB (ref 6–23)
CO2: 26 mEq/L (ref 19–32)
Calcium: 9.4 mg/dL (ref 8.4–10.5)
Chloride: 100 mEq/L (ref 96–112)
Creatinine, Ser: 0.91 mg/dL (ref 0.50–1.10)
GFR calc Af Amer: 70 mL/min — ABNORMAL LOW (ref >90)
GFR calc non Af Amer: 61 mL/min — ABNORMAL LOW (ref >90)
Glucose, Bld: 116 mg/dL — ABNORMAL HIGH (ref 70–99)
POTASSIUM: 4.3 meq/L (ref 3.7–5.3)
SODIUM: 139 meq/L (ref 137–147)
TOTAL PROTEIN: 7.5 g/dL (ref 6.0–8.3)
Total Bilirubin: 0.3 mg/dL (ref 0.3–1.2)

## 2013-11-02 LAB — CBC WITH DIFFERENTIAL/PLATELET
Basophils Absolute: 0 10*3/uL (ref 0.0–0.1)
Basophils Relative: 1 % (ref 0–1)
EOS ABS: 0.1 10*3/uL (ref 0.0–0.7)
Eosinophils Relative: 2 % (ref 0–5)
HCT: 38 % (ref 36.0–46.0)
Hemoglobin: 12.4 g/dL (ref 12.0–15.0)
Lymphocytes Relative: 12 % (ref 12–46)
Lymphs Abs: 0.9 10*3/uL (ref 0.7–4.0)
MCH: 28.6 pg (ref 26.0–34.0)
MCHC: 32.6 g/dL (ref 30.0–36.0)
MCV: 87.8 fL (ref 78.0–100.0)
Monocytes Absolute: 0.6 10*3/uL (ref 0.1–1.0)
Monocytes Relative: 8 % (ref 3–12)
NEUTROS PCT: 77 % (ref 43–77)
Neutro Abs: 6.2 10*3/uL (ref 1.7–7.7)
PLATELETS: 406 10*3/uL — AB (ref 150–400)
RBC: 4.33 MIL/uL (ref 3.87–5.11)
RDW: 15.1 % (ref 11.5–15.5)
WBC: 7.9 10*3/uL (ref 4.0–10.5)

## 2013-11-02 MED ORDER — CYCLOBENZAPRINE HCL 10 MG PO TABS: 10.0000 mg | ORAL_TABLET | Freq: Three times a day (TID) | ORAL | Status: AC | PRN

## 2013-11-02 MED ORDER — LANREOTIDE ACETATE 120 MG/0.5ML ~~LOC~~ SOLN
90.0000 mg | Freq: Once | SUBCUTANEOUS | Status: AC
  Administered 2013-11-02: 90 mg via SUBCUTANEOUS
  Filled 2013-11-02: qty 120

## 2013-11-02 MED ORDER — PREDNISONE (PAK) 10 MG PO TABS: ORAL_TABLET | Freq: Every day | ORAL | Status: DC

## 2013-11-02 MED ORDER — HYDROMORPHONE HCL 2 MG PO TABS
2.0000 mg | ORAL_TABLET | Freq: Once | ORAL | Status: AC
  Administered 2013-11-02: 2 mg via ORAL
  Filled 2013-11-02: qty 1

## 2013-11-02 NOTE — Progress Notes (Signed)
Sarah Casey presents today for injection per MD orders. Somatuline Depot 90 mg administered deep SQ in right upper outer Gluteal. Administration without incident. Patient tolerated well.

## 2013-11-07 LAB — SEROTONIN SERUM: SEROTONIN, SERUM: 2359 ng/mL — AB (ref 56–244)

## 2013-11-08 LAB — CHROMOGRANIN A: Chromogranin A: 756 ng/mL — ABNORMAL HIGH (ref 1.9–15.0)

## 2013-11-12 ENCOUNTER — Encounter: Payer: Self-pay | Admitting: Internal Medicine

## 2013-11-14 ENCOUNTER — Ambulatory Visit (INDEPENDENT_AMBULATORY_CARE_PROVIDER_SITE_OTHER): Payer: Medicare HMO | Admitting: Family Medicine

## 2013-11-14 ENCOUNTER — Encounter: Payer: Self-pay | Admitting: Family Medicine

## 2013-11-14 VITALS — BP 122/74 | Temp 98.1°F | Ht 63.0 in | Wt 157.0 lb

## 2013-11-14 DIAGNOSIS — I10 Essential (primary) hypertension: Secondary | ICD-10-CM

## 2013-11-14 DIAGNOSIS — C7951 Secondary malignant neoplasm of bone: Secondary | ICD-10-CM

## 2013-11-14 DIAGNOSIS — C7952 Secondary malignant neoplasm of bone marrow: Secondary | ICD-10-CM

## 2013-11-14 DIAGNOSIS — D3A00 Benign carcinoid tumor of unspecified site: Secondary | ICD-10-CM

## 2013-11-14 MED ORDER — OXYCODONE HCL 5 MG PO TABS: ORAL_TABLET | ORAL | Status: DC

## 2013-11-14 MED ORDER — CEPHALEXIN 500 MG PO CAPS: 500.0000 mg | ORAL_CAPSULE | Freq: Three times a day (TID) | ORAL | Status: DC

## 2013-11-14 MED ORDER — MUPIROCIN 2 % EX OINT: 1.0000 "application " | TOPICAL_OINTMENT | Freq: Two times a day (BID) | CUTANEOUS | Status: DC

## 2013-11-14 NOTE — Progress Notes (Signed)
   Subjective:    Patient ID: Sarah Casey, female    DOB: 01-Jun-1941, 73 y.o.   MRN: [id number]  HPISevere back pain. Started a few months ago. Taking flexeril, oxycodone, patient currently taking only 2-3 pain tablets per day.  Reports. Considerable back pain along with upper abdominal pain.  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 May be heading towards more experimental intervention through the subspecialists oncologist.  Having progressive trouble swallowing. The GI folks recommended an upper GI patient has not got around to this yet.   Abdominal swelling, abdomen feels hot. Started about 1 month ago.  Trouble swallowing.   Pain started couple months ago  Saw dr Sydell Axon started on protonix and saw pt with trouble swallowin  Had trouble with swallowing, had esoph stretched, then they added protonix.  Stomach is hurting quite abit  Stomach is covered with film  Choked almost with every meal  Took oxycodone up to twice per d, then a round of pred, and now a muscle relaxer (which causes drowsiness)  Review of Systems Some weight loss diminished energy diminished appetite some insomnia no constipation discussed on ROS otherwise negative    Objective:   Physical Exam Alert mild malaise. Vital stable H&T normal. Lungs clear heart regular in rhythm. Epigastric tenderness. Mid back tenderness to percussion. Umbilical region erythematous patch with slight draining at site of old surgery.       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 Discussed at length. Pain control insufficient. #2 reflux. #3 dysphagia in the midst of workup by GI specialist #4 skin infection. #5 patient frustrated and understandably concerned at the nature of progressive cancer discussed at length. Plan increase oxycodone to 5 times per day. 180 tablets written. Encouraged to get  back with GI specialist for continued workup. Keflex 3 times a day for 10 days. Symptomatic care discussed. followup in oe month. WSL

## 2013-11-14 NOTE — Patient Instructions (Signed)
Add daily scoop of miralax daily for constipation

## 2013-11-23 NOTE — Progress Notes (Signed)
Labs drawn

## 2013-11-29 ENCOUNTER — Encounter (HOSPITAL_COMMUNITY): Payer: Medicare HMO | Attending: Hematology and Oncology

## 2013-11-29 ENCOUNTER — Encounter (HOSPITAL_BASED_OUTPATIENT_CLINIC_OR_DEPARTMENT_OTHER): Payer: Medicare HMO

## 2013-11-29 DIAGNOSIS — D3A00 Benign carcinoid tumor of unspecified site: Secondary | ICD-10-CM | POA: Diagnosis present

## 2013-11-29 DIAGNOSIS — C787 Secondary malignant neoplasm of liver and intrahepatic bile duct: Secondary | ICD-10-CM

## 2013-11-29 DIAGNOSIS — E340 Carcinoid syndrome: Secondary | ICD-10-CM

## 2013-11-29 DIAGNOSIS — M549 Dorsalgia, unspecified: Secondary | ICD-10-CM | POA: Diagnosis not present

## 2013-11-29 DIAGNOSIS — C7A00 Malignant carcinoid tumor of unspecified site: Secondary | ICD-10-CM

## 2013-11-29 LAB — COMPREHENSIVE METABOLIC PANEL
ALT: 26 U/L (ref 0–35)
AST: 27 U/L (ref 0–37)
Albumin: 2.9 g/dL — ABNORMAL LOW (ref 3.5–5.2)
Alkaline Phosphatase: 344 U/L — ABNORMAL HIGH (ref 39–117)
BUN: 30 mg/dL — AB (ref 6–23)
CALCIUM: 9.1 mg/dL (ref 8.4–10.5)
CO2: 28 mEq/L (ref 19–32)
Chloride: 104 mEq/L (ref 96–112)
Creatinine, Ser: 0.86 mg/dL (ref 0.50–1.10)
GFR calc non Af Amer: 65 mL/min — ABNORMAL LOW (ref >90)
GFR, EST AFRICAN AMERICAN: 75 mL/min — AB (ref >90)
Glucose, Bld: 108 mg/dL — ABNORMAL HIGH (ref 70–99)
Potassium: 4.3 mEq/L (ref 3.7–5.3)
Sodium: 144 mEq/L (ref 137–147)
TOTAL PROTEIN: 6.9 g/dL (ref 6.0–8.3)
Total Bilirubin: 0.4 mg/dL (ref 0.3–1.2)

## 2013-11-29 LAB — CBC WITH DIFFERENTIAL/PLATELET
Basophils Absolute: 0 10*3/uL (ref 0.0–0.1)
Basophils Relative: 1 % (ref 0–1)
EOS ABS: 0.1 10*3/uL (ref 0.0–0.7)
EOS PCT: 1 % (ref 0–5)
HCT: 38.1 % (ref 36.0–46.0)
HEMOGLOBIN: 12.1 g/dL (ref 12.0–15.0)
LYMPHS ABS: 1.1 10*3/uL (ref 0.7–4.0)
Lymphocytes Relative: 19 % (ref 12–46)
MCH: 27.7 pg (ref 26.0–34.0)
MCHC: 31.8 g/dL (ref 30.0–36.0)
MCV: 87.2 fL (ref 78.0–100.0)
MONOS PCT: 8 % (ref 3–12)
Monocytes Absolute: 0.5 10*3/uL (ref 0.1–1.0)
Neutro Abs: 4.2 10*3/uL (ref 1.7–7.7)
Neutrophils Relative %: 71 % (ref 43–77)
PLATELETS: 540 10*3/uL — AB (ref 150–400)
RBC: 4.37 MIL/uL (ref 3.87–5.11)
RDW: 14.7 % (ref 11.5–15.5)
WBC: 5.9 10*3/uL (ref 4.0–10.5)

## 2013-11-29 MED ORDER — LANREOTIDE ACETATE 120 MG/0.5ML ~~LOC~~ SOLN
90.0000 mg | Freq: Once | SUBCUTANEOUS | Status: AC
  Administered 2013-11-29: 90 mg via SUBCUTANEOUS
  Filled 2013-11-29: qty 120

## 2013-11-29 NOTE — Progress Notes (Signed)
Braxton Feathers presents today for injection per MD orders. somatuline depot 90 mg administered SQ in left Gluteal. Administration without incident. Patient tolerated well.

## 2013-11-29 NOTE — Progress Notes (Signed)
Labs drawn for cbc/diff, cmp, serotonin, chromogranin a.

## 2013-12-04 LAB — SEROTONIN SERUM: SEROTONIN, SERUM: 2976 ng/mL — AB (ref 56–244)

## 2013-12-05 LAB — CHROMOGRANIN A: CHROMOGRANIN A: 810 ng/mL — AB (ref 1.9–15.0)

## 2013-12-13 ENCOUNTER — Other Ambulatory Visit: Payer: Self-pay | Admitting: Family Medicine

## 2013-12-17 ENCOUNTER — Encounter: Payer: Self-pay | Admitting: Family Medicine

## 2013-12-17 ENCOUNTER — Ambulatory Visit (HOSPITAL_COMMUNITY)
Admission: RE | Admit: 2013-12-17 | Discharge: 2013-12-17 | Disposition: A | Discharge status: Home / Self Care | Payer: Medicare HMO | Source: Ambulatory Visit | Attending: Family Medicine | Admitting: Family Medicine

## 2013-12-17 ENCOUNTER — Ambulatory Visit (INDEPENDENT_AMBULATORY_CARE_PROVIDER_SITE_OTHER): Payer: Medicare HMO | Admitting: Family Medicine

## 2013-12-17 VITALS — BP 130/80 | Ht 63.0 in | Wt 165.0 lb

## 2013-12-17 DIAGNOSIS — Y92009 Unspecified place in unspecified non-institutional (private) residence as the place of occurrence of the external cause: Secondary | ICD-10-CM

## 2013-12-17 DIAGNOSIS — D3A00 Benign carcinoid tumor of unspecified site: Secondary | ICD-10-CM

## 2013-12-17 DIAGNOSIS — W19XXXA Unspecified fall, initial encounter: Secondary | ICD-10-CM

## 2013-12-17 DIAGNOSIS — M25559 Pain in unspecified hip: Secondary | ICD-10-CM | POA: Insufficient documentation

## 2013-12-17 DIAGNOSIS — I10 Essential (primary) hypertension: Secondary | ICD-10-CM

## 2013-12-17 DIAGNOSIS — C7951 Secondary malignant neoplasm of bone: Secondary | ICD-10-CM

## 2013-12-17 DIAGNOSIS — K219 Gastro-esophageal reflux disease without esophagitis: Secondary | ICD-10-CM

## 2013-12-17 DIAGNOSIS — C7952 Secondary malignant neoplasm of bone marrow: Secondary | ICD-10-CM

## 2013-12-17 MED ORDER — OXYCODONE HCL 5 MG PO TABS: ORAL_TABLET | ORAL | Status: AC

## 2013-12-17 NOTE — Progress Notes (Signed)
   Subjective:    Patient ID: Sarah Casey, female    DOB: August 15, 1940, 73 y.o.   MRN: [id number]  HPI Patient is here today because she fell on Thursday and now she has been experiencing left shoulder pain and left hip pain. Edema in both ankles and knees noted also. Pain scale 8/10. Shaking in both hands is noted.   Patient states she has so many concerns that the doctor would not be able to answer them all.     new dose of pain meds seems to be working better. No obvious side effects with that. No excess drowsiness. No constipation.  Patient took a fall several days ago. Just told her family about it. Notes discomfort on the left clavicle. Also notes left hip pain.  Family notes patient is limping somewhat.  Due to have a scan in just a few days. Further management coming from the oncologists at that time based on the results.  Family would like to have commode extension and walker with seat and hospital bed and do to her progressive weakness. I think this is very appropriate.  Patient notes diminished appetite for solid foods. See prior note. She did not get back to the GI doctors as instructed. No major dysphagia at this time just diminished appetite for solid foods. Along with nausea.  Also notes swollen ankles wonders about that and possible cause.  Review of Systems No frank vomiting no rash no fever no chills no cough positive diminished appetite positive diminished energy. Positive mental stress 2 to progressive cancer    Objective:   Physical Exam  Alert fatigued. Tearful at times. In some discomfort. Left clavicle tender no deformity. Hip good range of motion some lateral tenderness. Ankles 1+ edema. Lungs clear. Heart rare rhythm. Mild epigastric discomfort and right upper quadrant discomfort to palpation.      Assessment & Plan:  Impression 1 metastatic carcinoid tumor worsening discuss. #2 acute injury is left clavicle and left hip primarily. Hopefully dispersion.  Could be fracture however #3 peripheral edema likely due to low protein along with intra-abdominal and retroperitoneal involvement of tumor compressing veins discussed #4 chronic pain management discussed. #5 need for further durable medical goods to assist patient. Discussed. Plan prescribed as noted. Appropriate x-rays. Addendum x-rays returned negative. Maintain same pain medicine. Medication refilled. Recheck in one month. Encouraged to try [employer] instant breakfast. Easily 40 minutes spent most in discussion. WSL

## 2013-12-18 ENCOUNTER — Telehealth: Payer: Self-pay | Admitting: Family Medicine

## 2013-12-18 NOTE — Telephone Encounter (Signed)
Patients daughter calling to check on patients xrays. Also, wants to know, if the clavicle is broken, what pharmacy will sling be sent to?

## 2013-12-19 ENCOUNTER — Ambulatory Visit (HOSPITAL_COMMUNITY): Payer: Medicare HMO

## 2013-12-20 ENCOUNTER — Telehealth: Payer: Self-pay | Admitting: Family Medicine

## 2013-12-20 MED ORDER — FLUCONAZOLE 200 MG PO TABS: 200.0000 mg | ORAL_TABLET | Freq: Every day | ORAL | Status: AC

## 2013-12-20 MED ORDER — NYSTATIN 100000 UNIT/ML MT SUSP: 5.0000 mL | Freq: Four times a day (QID) | OROMUCOSAL | Status: AC

## 2013-12-20 NOTE — Telephone Encounter (Signed)
Daughter states that patient's tongue is covered in a white film. She can hardly taste anything now. Patient was prescribed Dukes Magic mouthwash on 11/14/12 and it is not working. Can something else be prescribed for her thrush? [facility]

## 2013-12-20 NOTE — Telephone Encounter (Signed)
I would recommend Diflucan 200 mg one daily for 7 days. I also recommend nystatin oral solution 1 teaspoon swish and swallow 4 times a day for 7 days

## 2013-12-20 NOTE — Telephone Encounter (Signed)
Patient is not having any luck with the mouthwash that was called in for her thrush. Wants to know if we can call something else in?   [facility]

## 2013-12-20 NOTE — Telephone Encounter (Signed)
Patients daughter notified and verbalized understanding 

## 2013-12-21 ENCOUNTER — Ambulatory Visit (HOSPITAL_COMMUNITY): Payer: Medicare HMO

## 2013-12-24 ENCOUNTER — Ambulatory Visit: Payer: Medicare HMO | Admitting: Family Medicine

## 2013-12-26 ENCOUNTER — Ambulatory Visit (HOSPITAL_COMMUNITY)
Admission: RE | Admit: 2013-12-26 | Discharge: 2013-12-26 | Disposition: A | Discharge status: Home / Self Care | Payer: Medicare HMO | Source: Ambulatory Visit | Attending: Oncology | Admitting: Oncology

## 2013-12-26 ENCOUNTER — Encounter (HOSPITAL_COMMUNITY): Payer: Self-pay

## 2013-12-26 DIAGNOSIS — C7951 Secondary malignant neoplasm of bone: Secondary | ICD-10-CM

## 2013-12-26 DIAGNOSIS — C7800 Secondary malignant neoplasm of unspecified lung: Secondary | ICD-10-CM | POA: Insufficient documentation

## 2013-12-26 DIAGNOSIS — D3A00 Benign carcinoid tumor of unspecified site: Secondary | ICD-10-CM

## 2013-12-26 DIAGNOSIS — I7090 Unspecified atherosclerosis: Secondary | ICD-10-CM | POA: Insufficient documentation

## 2013-12-26 DIAGNOSIS — I2789 Other specified pulmonary heart diseases: Secondary | ICD-10-CM | POA: Insufficient documentation

## 2013-12-26 DIAGNOSIS — C7952 Secondary malignant neoplasm of bone marrow: Secondary | ICD-10-CM

## 2013-12-26 DIAGNOSIS — C787 Secondary malignant neoplasm of liver and intrahepatic bile duct: Secondary | ICD-10-CM | POA: Insufficient documentation

## 2013-12-26 DIAGNOSIS — R9389 Abnormal findings on diagnostic imaging of other specified body structures: Secondary | ICD-10-CM | POA: Insufficient documentation

## 2013-12-26 DIAGNOSIS — R188 Other ascites: Secondary | ICD-10-CM | POA: Insufficient documentation

## 2013-12-26 MED ORDER — SODIUM CHLORIDE 0.9 % IJ SOLN
INTRAMUSCULAR | Status: AC
  Filled 2013-12-26: qty 500

## 2013-12-26 MED ORDER — IOHEXOL 300 MG/ML  SOLN
100.0000 mL | Freq: Once | INTRAMUSCULAR | Status: AC | PRN
  Administered 2013-12-26: 100 mL via INTRAVENOUS

## 2013-12-27 ENCOUNTER — Encounter (HOSPITAL_COMMUNITY): Payer: Medicare HMO | Attending: Oncology

## 2013-12-27 ENCOUNTER — Encounter (HOSPITAL_BASED_OUTPATIENT_CLINIC_OR_DEPARTMENT_OTHER): Payer: Medicare HMO

## 2013-12-27 ENCOUNTER — Other Ambulatory Visit (HOSPITAL_COMMUNITY): Payer: Commercial Managed Care - HMO

## 2013-12-27 VITALS — BP 133/83 | HR 90 | Resp 20

## 2013-12-27 DIAGNOSIS — E340 Carcinoid syndrome: Secondary | ICD-10-CM

## 2013-12-27 DIAGNOSIS — F17200 Nicotine dependence, unspecified, uncomplicated: Secondary | ICD-10-CM | POA: Diagnosis not present

## 2013-12-27 DIAGNOSIS — R110 Nausea: Secondary | ICD-10-CM | POA: Diagnosis not present

## 2013-12-27 DIAGNOSIS — Z7401 Bed confinement status: Secondary | ICD-10-CM | POA: Diagnosis not present

## 2013-12-27 DIAGNOSIS — R197 Diarrhea, unspecified: Secondary | ICD-10-CM | POA: Diagnosis not present

## 2013-12-27 DIAGNOSIS — C7A00 Malignant carcinoid tumor of unspecified site: Secondary | ICD-10-CM

## 2013-12-27 DIAGNOSIS — D3A00 Benign carcinoid tumor of unspecified site: Secondary | ICD-10-CM

## 2013-12-27 DIAGNOSIS — Z79899 Other long term (current) drug therapy: Secondary | ICD-10-CM | POA: Diagnosis not present

## 2013-12-27 DIAGNOSIS — C7A019 Malignant carcinoid tumor of the small intestine, unspecified portion: Secondary | ICD-10-CM | POA: Insufficient documentation

## 2013-12-27 DIAGNOSIS — C787 Secondary malignant neoplasm of liver and intrahepatic bile duct: Secondary | ICD-10-CM

## 2013-12-27 DIAGNOSIS — R630 Anorexia: Secondary | ICD-10-CM | POA: Insufficient documentation

## 2013-12-27 LAB — COMPREHENSIVE METABOLIC PANEL
ALK PHOS: 456 U/L — AB (ref 39–117)
ALT: 34 U/L (ref 0–35)
ANION GAP: 14 (ref 5–15)
AST: 42 U/L — ABNORMAL HIGH (ref 0–37)
Albumin: 2.8 g/dL — ABNORMAL LOW (ref 3.5–5.2)
BILIRUBIN TOTAL: 0.8 mg/dL (ref 0.3–1.2)
BUN: 46 mg/dL — ABNORMAL HIGH (ref 6–23)
CHLORIDE: 101 meq/L (ref 96–112)
CO2: 23 meq/L (ref 19–32)
Calcium: 8.6 mg/dL (ref 8.4–10.5)
Creatinine, Ser: 1.22 mg/dL — ABNORMAL HIGH (ref 0.50–1.10)
GFR calc non Af Amer: 43 mL/min — ABNORMAL LOW (ref >90)
GFR, EST AFRICAN AMERICAN: 49 mL/min — AB (ref >90)
GLUCOSE: 112 mg/dL — AB (ref 70–99)
Potassium: 3.9 mEq/L (ref 3.7–5.3)
Sodium: 138 mEq/L (ref 137–147)
Total Protein: 6.7 g/dL (ref 6.0–8.3)

## 2013-12-27 LAB — CBC WITH DIFFERENTIAL/PLATELET
Basophils Absolute: 0 10*3/uL (ref 0.0–0.1)
Basophils Relative: 0 % (ref 0–1)
Eosinophils Absolute: 0 10*3/uL (ref 0.0–0.7)
Eosinophils Relative: 0 % (ref 0–5)
HCT: 37.1 % (ref 36.0–46.0)
Hemoglobin: 12.2 g/dL (ref 12.0–15.0)
LYMPHS ABS: 1.2 10*3/uL (ref 0.7–4.0)
Lymphocytes Relative: 14 % (ref 12–46)
MCH: 27.7 pg (ref 26.0–34.0)
MCHC: 32.9 g/dL (ref 30.0–36.0)
MCV: 84.1 fL (ref 78.0–100.0)
MONOS PCT: 8 % (ref 3–12)
Monocytes Absolute: 0.6 10*3/uL (ref 0.1–1.0)
NEUTROS ABS: 6.2 10*3/uL (ref 1.7–7.7)
NEUTROS PCT: 78 % — AB (ref 43–77)
Platelets: 489 10*3/uL — ABNORMAL HIGH (ref 150–400)
RBC: 4.41 MIL/uL (ref 3.87–5.11)
RDW: 16 % — ABNORMAL HIGH (ref 11.5–15.5)
WBC: 8 10*3/uL (ref 4.0–10.5)

## 2013-12-27 MED ORDER — LANREOTIDE ACETATE 120 MG/0.5ML ~~LOC~~ SOLN
90.0000 mg | Freq: Once | SUBCUTANEOUS | Status: AC
  Administered 2013-12-27: 90 mg via SUBCUTANEOUS
  Filled 2013-12-27: qty 120

## 2013-12-27 NOTE — Progress Notes (Signed)
Braxton Feathers presents today for injection per MD orders. Somatulin 90mg  administered SQ in right gluteal. Administration without incident. Patient tolerated well.

## 2013-12-27 NOTE — Progress Notes (Signed)
Labs drawn

## 2014-01-01 LAB — CHROMOGRANIN A: Chromogranin A: 3.2 ng/mL (ref 1.9–15.0)

## 2014-01-01 LAB — SEROTONIN SERUM: Serotonin, Serum: 2001 ng/mL — ABNORMAL HIGH (ref 56–244)

## 2014-01-03 ENCOUNTER — Encounter (HOSPITAL_COMMUNITY): Payer: Self-pay

## 2014-01-03 ENCOUNTER — Encounter (HOSPITAL_BASED_OUTPATIENT_CLINIC_OR_DEPARTMENT_OTHER): Payer: Medicare HMO

## 2014-01-03 ENCOUNTER — Other Ambulatory Visit (HOSPITAL_COMMUNITY): Payer: Self-pay | Admitting: Oncology

## 2014-01-03 VITALS — BP 111/64 | HR 88 | Temp 96.9°F | Resp 20 | Wt 163.9 lb

## 2014-01-03 DIAGNOSIS — R630 Anorexia: Secondary | ICD-10-CM

## 2014-01-03 DIAGNOSIS — C7951 Secondary malignant neoplasm of bone: Secondary | ICD-10-CM

## 2014-01-03 DIAGNOSIS — C7A019 Malignant carcinoid tumor of the small intestine, unspecified portion: Secondary | ICD-10-CM

## 2014-01-03 DIAGNOSIS — D3A00 Benign carcinoid tumor of unspecified site: Secondary | ICD-10-CM

## 2014-01-03 DIAGNOSIS — C787 Secondary malignant neoplasm of liver and intrahepatic bile duct: Secondary | ICD-10-CM

## 2014-01-03 DIAGNOSIS — C7B8 Other secondary neuroendocrine tumors: Secondary | ICD-10-CM

## 2014-01-03 MED ORDER — METOCLOPRAMIDE HCL 5 MG PO TABS: 10.0000 mg | ORAL_TABLET | Freq: Three times a day (TID) | ORAL | Status: AC

## 2014-01-03 MED ORDER — MEGESTROL ACETATE 625 MG/5ML PO SUSP: 625.0000 mg | Freq: Every day | ORAL | Status: AC

## 2014-01-03 NOTE — Progress Notes (Signed)
DNR form signed by Dr Barnet Glasgow

## 2014-01-03 NOTE — Progress Notes (Signed)
[facility]  OFFICE PROGRESS NOTE  Rubbie Battiest, MD [place] [address]  DIAGNOSIS: Carcinoid tumor  Bone metastases  Liver metastases  Chief Complaint  Patient presents with  . Neuroendocrine tumor metastatic    CURRENT THERAPY: Lantreotide 90 mg intramuscularly monthly with last treatment on  12/27/2013.  INTERVAL HISTORY: RUARI DUGGAN 73 y.o. 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 Most recent CT scan done on 12/26/2013 showed progression of disease in lung and liver and for that reason the patient has been referred to Dr. Leslie Andrea at [facility] with an appointment on 01/21/2014.  The patient has had 2 falls with injury to the shoulder without bone fracture. She is weak and has lower 70 weakness primarily without paresthesias. She continues to have diarrhea but much less than previously down to 5 or 6 per day instead of 10-12 per day. She denies any melena, hematochezia, and has been nauseated with belching. She denies any cough, PND, orthopnea, but does have lower 70 edema as well as left upper extremity edema. Appetite is poor with decreased by mouth intake. She denies any headache but has had fevers and night sweats. She also has intermittent flushing of her skin.  MEDICAL HISTORY: Past Medical History  Diagnosis Date  . Fracture of ankle     right  . Hypertension   . Ulcer   . 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 She has a very elevated serum  serotonin level to over 1600 and she has an elevated 5-HIAA level in her urine.   . PUD (peptic ulcer disease)   . Bone metastases 09/21/2013    INTERIM HISTORY: has Metastatic recurrent carcinoid; Essential hypertension, benign; Esophageal reflux; Allergic rhinitis; Esophageal dysphagia; Rectal bleeding; and Bone metastases on her problem list.   metastatic recurrent carcinoid to liver, retroperitoneal lymph nodes, mesenteric lymph nodes, with mesenteric stranding. She continues to display progression of disease by CT scan criteria. Symptomatically, she is improved with 20 mg of Octreotide every 14 days (compared to 40 mg every 4 weeks), but with continued radiographic progression, we switched to Lanreotide 90 mg injections every 4 weeks beginning on 09/07/2013. This dose, depending on symptoms, can be increased if needed to 120 mg. The ELECT trial has demonstrated lanreotide's effectiveness as a rescue therapy. This trial has confirmed lanreotide's positive benefit-risk profile and safety/tolerability. -J Clin Oncol 32, 2014 (suppl 3; abstr 268 Repeat CT scan chest, abdomen, and pelvis 12/26/2013 showed progressive disease. Referral to [facility] to see Dr. Leslie Andrea on 01/21/2014.  ALLERGIES:  is allergic to amoxil; lodine; and vasotec.  MEDICATIONS: has a current medication list which includes the following prescription(s): amlodipine, cyclobenzaprine, oxycodone, and pantoprazole.  SURGICAL HISTORY:  Past Surgical History  Procedure Laterality Date  . Dilation and curettage of uterus    . Cholecystectomy    . Hernia repair      umbilical  . Ankle debridement      right for staff infection  . Colonoscopy  11/09/2003    XLK:GMWN small polyps.  One from cecum was snared.  The others were biopsied three in the area of hepatic flexure and one at rectum.  None of these polyps appeared to be malignant/Sigmoid colon diverticulosis  . Esophagogastroduodenoscopy   11/01/2003    LOV:FIEPPIRJ bulbar  erosion/edema as described above/ 2. Distal esophagus, distal stomach, and pylorus appeared normal/Normal Ampulla of Vater as described above  . Given capsule small bowel  11/09/2003    JOA:CZYSA bowel polypoidal lesion with active bleeding, very  suspicious for primary carcinoid.  There was a second submucosal mass probably another metastatic lesion  . Esophagogastroduodenoscopy (egd) with esophageal dilation N/A 08/07/2013    YTK:ZSWFUXNA'T ring /  mild erosive reflux esophagitis-status post Maloney dilation and biopsy disruption of ring. Status post biopsy of abnormal gastric mucosa. bx benign    FAMILY HISTORY: family history includes Cancer in her sister, sister, and sister.  SOCIAL HISTORY:  reports that she has been smoking Cigarettes.  She has a 35 pack-year smoking history. She has never used smokeless tobacco. She reports that she drinks alcohol. She reports that she does not use illicit drugs.  REVIEW OF SYSTEMS:  Other than that discussed above is noncontributory.  PHYSICAL EXAMINATION: ECOG PERFORMANCE STATUS: 2 - Symptomatic, <50% confined to bed  There were no vitals taken for this visit.  GENERAL:alert, no distress and comfortable SKIN: skin color, texture, turgor are normal, no rashes or significant lesions EYES: PERLA; Conjunctiva are pink and non-injected, sclera clear SINUSES: No redness or tenderness over maxillary or ethmoid sinuses OROPHARYNX:no exudate, no erythema on lips, buccal mucosa, or tongue. NECK: supple, thyroid normal size, non-tender, without nodularity. No masses. No jugular venous distention at 45. CHEST: Normal AP diameter with no breast masses.Marland Kitchen LYMPH:  no palpable lymphadenopathy in the cervical, axillary or inguinal LUNGS: clear to auscultation and percussion with normal breathing effort HEART: regular rate & rhythm and no murmurs. No S3. No diastolic murmur. ABDOMEN:abdomen soft, non-tender and normal bowel sounds MUSCULOSKELETAL:no cyanosis of  digits and no clubbing. Range of motion normal. +2 lower 70 edema bilaterally with +1 edema left upper extremity. NEURO: alert & oriented x 3 with fluent speech, no focal motor/sensory deficits   LABORATORY DATA: Lab on 12/27/2013  Component Date Value Ref Range Status  . WBC 12/27/2013 8.0  4.0 - 10.5 K/uL Final  . RBC 12/27/2013 4.41  3.87 - 5.11 MIL/uL Final  . Hemoglobin 12/27/2013 12.2  12.0 - 15.0 g/dL Final  . HCT 12/27/2013 37.1  36.0 - 46.0 % Final  . MCV 12/27/2013 84.1  78.0 - 100.0 fL Final  . MCH 12/27/2013 27.7  26.0 - 34.0 pg Final  . MCHC 12/27/2013 32.9  30.0 - 36.0 g/dL Final  . RDW 12/27/2013 16.0* 11.5 - 15.5 % Final  . Platelets 12/27/2013 489* 150 - 400 K/uL Final  . Neutrophils Relative % 12/27/2013 78* 43 - 77 % Final  . Neutro Abs 12/27/2013 6.2  1.7 - 7.7 K/uL Final  . Lymphocytes Relative 12/27/2013 14  12 - 46 % Final  . Lymphs Abs 12/27/2013 1.2  0.7 - 4.0 K/uL Final  . Monocytes Relative 12/27/2013 8  3 - 12 % Final  . Monocytes Absolute 12/27/2013 0.6  0.1 - 1.0 K/uL Final  . Eosinophils Relative 12/27/2013 0  0 - 5 % Final  . Eosinophils Absolute 12/27/2013 0.0  0.0 - 0.7 K/uL Final  . Basophils Relative 12/27/2013 0  0 - 1 % Final  . Basophils Absolute 12/27/2013 0.0  0.0 -  0.1 K/uL Final  . Sodium 12/27/2013 138  137 - 147 mEq/L Final  . Potassium 12/27/2013 3.9  3.7 - 5.3 mEq/L Final  . Chloride 12/27/2013 101  96 - 112 mEq/L Final  . CO2 12/27/2013 23  19 - 32 mEq/L Final  . Glucose, Bld 12/27/2013 112* 70 - 99 mg/dL Final  . BUN 12/27/2013 46* 6 - 23 mg/dL Final  . Creatinine, Ser 12/27/2013 1.22* 0.50 - 1.10 mg/dL Final  . Calcium 12/27/2013 8.6  8.4 - 10.5 mg/dL Final  . Total Protein 12/27/2013 6.7  6.0 - 8.3 g/dL Final  . Albumin 12/27/2013 2.8* 3.5 - 5.2 g/dL Final  . AST 12/27/2013 42* 0 - 37 U/L Final  . ALT 12/27/2013 34  0 - 35 U/L Final  . Alkaline Phosphatase 12/27/2013 456* 39 - 117 U/L Final  . Total Bilirubin 12/27/2013 0.8   0.3 - 1.2 mg/dL Final  . GFR calc non Af Amer 12/27/2013 43* >90 mL/min Final  . GFR calc Af Amer 12/27/2013 49* >90 mL/min Final   Comment: (NOTE)                          The eGFR has been calculated using the CKD EPI equation.                          This calculation has not been validated in all clinical situations.                          eGFR's persistently <90 mL/min signify possible Chronic Kidney                          Disease.  . Anion gap 12/27/2013 14  5 - 15 Final  . Serotonin, Serum 12/27/2013 2001* 56 - 244 ng/mL Final   Performed at [facility]  . Chromogranin A 12/27/2013 3.2  1.9 - 15.0 ng/mL Final   Comment: (NOTE)                          This test was performed using a laboratory developed                          electrochemiluminescent method. Values obtained with different assay                          methods cannot be used interchangeably. Chromogranin A levels,                          regardless of value, should not be interpreted as absolute evidence of                          the presence or absence of disease.                          This test was developed and its performance characteristics have been                          determined by [employer], [place]. Performance characteristics refer to the analytical                          performance of the test.                          Performed at [facility]: No new pathology.  Urinalysis No results found for this basename: colorurine,  appearanceur,  labspec,  phurine,  glucoseu,  hgbur,  bilirubinur,  ketonesur,  proteinur,  urobilinogen,  nitrite,  leukocytesur    RADIOGRAPHIC STUDIES:  NM OCTREOTIDE LOCALIZATION W/SPECT Status: Final result         PACS Images    Show images for NM OCTREOTIDE LOCALIZATION W/SPECT         Study Result    CLINICAL DATA  Metastatic carcinoid tumor    EXAM  NUCLEAR MEDICINE OCTREOTIDE (SOMATOSTATIN-RECEPTOR) SCAN  TECHNIQUE  Following intravenous administration of radiopharmaceutical, whole  body images of the head, neck, trunk, and extremities were obtained  on subsequent days.  RADIOPHARMACEUTICALS  6 mCi OCTREOSCAN INDIUM IN-111 PENTETREOTIDE IV KIT  COMPARISON  11/13/2003 ; correlation made to CT abdomen/pelvis 08/21/2013  FINDINGS  Multiple abnormal sites of abnormal octreotide localization are  identified compatible with widespread metastatic carcinoid tumor.  These include liver, retroperitoneal and upper abdominal lymph  nodes, pelvis, femora, humeri, left shoulder, left clavicle, and  proximal left tibia.  Additional foci of increased tracer localization the chest likely  represent thoracic adenopathy. Additional uptake within thoracic  spine.  IMPRESSION  Multiple sites of abnormal tracer accumulation compatible with  metastatic carcinoid tumor to liver, bone, and lymph nodes as above.  SIGNATURE  Electronically Signed  By: Lavonia Dana M.D.  On: 09/04/2013       Dg Clavicle Left  12/18/2013   CLINICAL DATA:  Fall.  EXAM: LEFT CLAVICLE - 2+ VIEWS  COMPARISON:  None.  FINDINGS: There is no evidence of fracture or other focal bone lesions. Soft tissues are unremarkable.  IMPRESSION: Negative.   Electronically Signed   By: Marcello Moores  Register   On: 12/18/2013 08:21   Dg Hip Complete Left  12/18/2013   CLINICAL DATA:  Status post fall.  Left hip pain.  EXAM: LEFT HIP - COMPLETE 2+ VIEW  COMPARISON:  None.  FINDINGS: Normal anatomic alignment. No evidence for acute fracture or dislocation. No significant osseous degenerative change.  IMPRESSION: No evidence for acute fracture or dislocation   Electronically Signed   By: Lovey Newcomer M.D.   On: 12/18/2013 08:22   Ct Chest W Contrast  12/26/2013   CLINICAL DATA:  Metastatic carcinoid tumor. Bone metastasis. Liver metastasis.  EXAM: CT CHEST, ABDOMEN, AND PELVIS WITH CONTRAST   TECHNIQUE: Multidetector CT imaging of the chest, abdomen and pelvis was performed following the standard protocol during bolus administration of intravenous contrast.  CONTRAST:  157mL OMNIPAQUE IOHEXOL 300 MG/ML  SOLN  COMPARISON:  08/21/2013 abdominal pelvic CT. The most recent chest CT is 11/06/2003  FINDINGS: CT CHEST FINDINGS  Lungs/Pleura: Minimal motion degradation. Extensive pulmonary metastasis. Index right lower lobe nodule measures 1.1 cm on image 29 versus 8 mm on 08/21/2013.  Index left lower lobe nodule measures 9 mm on image 29 versus 7 mm on the prior of 08/21/2013.  A subpleural index left upper lobe nodule measures 8 mm on image 14. No lobar consolidation.  Trace right pleural fluid. A right paravertebral or  medial pleural 1.9 x 0.8 cm nodule on image 26.  Heart/Mediastinum: No supraclavicular adenopathy. Aortic atherosclerosis. Mild cardiomegaly with coronary artery atherosclerosis. No central pulmonary embolism, on this non-dedicated study. Mild pulmonary artery enlargement, with the outflow tract measuring 3.3 cm.  No hilar adenopathy. The upper esophagus is mildly dilated, with a contrast level within. Example image 10. Right cardiophrenic angle nodal metastasis measures 1.8 x 2.1 cm, including on image 24.  CT ABDOMEN AND PELVIS FINDINGS  Abdomen/Pelvis: Altered hepatic morphology, likely due to treated metastasis, underlying heterogeneous hepatic steatosis, and earlier phase of imaging today. Index lesion in the anterior segment right lobe of the liver measures 4.0 x 3.9 cm on image 41 versus 3.2 x 3.4 cm on the prior.  Lateral segment left liver lobe index lesion measures 6.5 x 5.0 cm on image 47 versus 5.8 x 4.4 cm on the prior.  Inferior right hepatic lobe 3.2 x 3.3 cm lesion on image 54 measured 2.0 x 1.9 cm on the prior.  Normal spleen, stomach, pancreas. Cholecystectomy, without biliary ductal dilatation. Normal adrenal glands and left kidney. Foci of hypo enhancement in the upper and  interpolar right kidney are new and suspicious for ischemia, possibly due to renal artery insufficiency.  Aortic and branch vessel atherosclerosis. Retroperitoneal adenopathy. Index nodal conglomerate in the left periaortic space measures 2.8 x 3.0 cm on image 62 versus 2.5 by 3.4 cm on the prior exam. More inferiorly, adenopathy measures 1.6 cm on image 69/series 2 versus 1.7 cm on the prior.  Gastrohepatic ligament node measures 1.9 cm on image 52 versus similar.  Normal caliber of large and small bowel loops. New moderate volume ascites.  No pelvic adenopathy. Normal urinary bladder. Heterogeneous enhancement in the uterine fundus could represent endometrial thickening or fluid. No adnexal mass.  Diffuse anasarca.  Fat containing ventral abdominal wall hernia.  Bones/Musculoskeletal: Osteopenia. Extensive osseous metastasis. Sclerosis in the inferior aspect of T12 appears increased on sagittal image 57. Moderate compression deformity at T3 is likely pathologic. No significant canal encroachment.  IMPRESSION: CT CHEST IMPRESSION  1. Mild progression of pulmonary metastasis. 2. Dominant right cardiophrenic angle nodal and right paravertebral versus medial pleural metastasis. 3. Esophageal air fluid level suggests dysmotility or gastroesophageal reflux. 4. Pulmonary artery enlargement suggests pulmonary arterial hypertension. 5.  Atherosclerosis, including within the coronary arteries. 6. T3 compression deformity, likely pathologic.  CT ABDOMEN AND PELVIS IMPRESSION  1. Mild progression of hepatic metastasis. 2. Similar nodal metastasis. 3. Foci of right renal hypoenhancement are favored to represent areas of ischemia/infarct due to renal artery insufficiency from atherosclerosis and/or adenopathy. Renal metastasis felt less likely. 4. Moderate volume new ascites. 5. Mild progression of osseous metastasis. 6. Heterogeneous enhancement at the uterine fundus could represent endometrial thickening or fluid. Correlate  with postmenopausal bleeding and possibly pelvic ultrasound.   Electronically Signed   By: Abigail Miyamoto M.D.   On: 12/26/2013 11:05   Ct Abdomen Pelvis W Contrast  12/26/2013   CLINICAL DATA:  Metastatic carcinoid tumor. Bone metastasis. Liver metastasis.  EXAM: CT CHEST, ABDOMEN, AND PELVIS WITH CONTRAST  TECHNIQUE: Multidetector CT imaging of the chest, abdomen and pelvis was performed following the standard protocol during bolus administration of intravenous contrast.  CONTRAST:  114mL OMNIPAQUE IOHEXOL 300 MG/ML  SOLN  COMPARISON:  08/21/2013 abdominal pelvic CT. The most recent chest CT is 11/06/2003  FINDINGS: CT CHEST FINDINGS  Lungs/Pleura: Minimal motion degradation. Extensive pulmonary metastasis. Index right lower lobe nodule measures 1.1 cm on image 29  versus 8 mm on 08/21/2013.  Index left lower lobe nodule measures 9 mm on image 29 versus 7 mm on the prior of 08/21/2013.  A subpleural index left upper lobe nodule measures 8 mm on image 14. No lobar consolidation.  Trace right pleural fluid. A right paravertebral or medial pleural 1.9 x 0.8 cm nodule on image 26.  Heart/Mediastinum: No supraclavicular adenopathy. Aortic atherosclerosis. Mild cardiomegaly with coronary artery atherosclerosis. No central pulmonary embolism, on this non-dedicated study. Mild pulmonary artery enlargement, with the outflow tract measuring 3.3 cm.  No hilar adenopathy. The upper esophagus is mildly dilated, with a contrast level within. Example image 10. Right cardiophrenic angle nodal metastasis measures 1.8 x 2.1 cm, including on image 24.  CT ABDOMEN AND PELVIS FINDINGS  Abdomen/Pelvis: Altered hepatic morphology, likely due to treated metastasis, underlying heterogeneous hepatic steatosis, and earlier phase of imaging today. Index lesion in the anterior segment right lobe of the liver measures 4.0 x 3.9 cm on image 41 versus 3.2 x 3.4 cm on the prior.  Lateral segment left liver lobe index lesion measures 6.5 x 5.0 cm  on image 47 versus 5.8 x 4.4 cm on the prior.  Inferior right hepatic lobe 3.2 x 3.3 cm lesion on image 54 measured 2.0 x 1.9 cm on the prior.  Normal spleen, stomach, pancreas. Cholecystectomy, without biliary ductal dilatation. Normal adrenal glands and left kidney. Foci of hypo enhancement in the upper and interpolar right kidney are new and suspicious for ischemia, possibly due to renal artery insufficiency.  Aortic and branch vessel atherosclerosis. Retroperitoneal adenopathy. Index nodal conglomerate in the left periaortic space measures 2.8 x 3.0 cm on image 62 versus 2.5 by 3.4 cm on the prior exam. More inferiorly, adenopathy measures 1.6 cm on image 69/series 2 versus 1.7 cm on the prior.  Gastrohepatic ligament node measures 1.9 cm on image 52 versus similar.  Normal caliber of large and small bowel loops. New moderate volume ascites.  No pelvic adenopathy. Normal urinary bladder. Heterogeneous enhancement in the uterine fundus could represent endometrial thickening or fluid. No adnexal mass.  Diffuse anasarca.  Fat containing ventral abdominal wall hernia.  Bones/Musculoskeletal: Osteopenia. Extensive osseous metastasis. Sclerosis in the inferior aspect of T12 appears increased on sagittal image 57. Moderate compression deformity at T3 is likely pathologic. No significant canal encroachment.  IMPRESSION: CT CHEST IMPRESSION  1. Mild progression of pulmonary metastasis. 2. Dominant right cardiophrenic angle nodal and right paravertebral versus medial pleural metastasis. 3. Esophageal air fluid level suggests dysmotility or gastroesophageal reflux. 4. Pulmonary artery enlargement suggests pulmonary arterial hypertension. 5.  Atherosclerosis, including within the coronary arteries. 6. T3 compression deformity, likely pathologic.  CT ABDOMEN AND PELVIS IMPRESSION  1. Mild progression of hepatic metastasis. 2. Similar nodal metastasis. 3. Foci of right renal hypoenhancement are favored to represent areas of  ischemia/infarct due to renal artery insufficiency from atherosclerosis and/or adenopathy. Renal metastasis felt less likely. 4. Moderate volume new ascites. 5. Mild progression of osseous metastasis. 6. Heterogeneous enhancement at the uterine fundus could represent endometrial thickening or fluid. Correlate with postmenopausal bleeding and possibly pelvic ultrasound.   Electronically Signed   By: Abigail Miyamoto M.D.   On: 12/26/2013 11:05    ASSESSMENT:  #1. Progressive neuroendocrine tumor of the intestine with metastases to liver, bones, lungs, and abdominal lymph nodes with improved endocrinopathy with lantreotide versus previous treatment with octreotide. Tumor is octreotide avid by scan in March 2015. #2. Anorexia with intermittent nausea    PLAN:  #  1. Megace daily. #2. Metoclopramide 5 mg before meals and at bedtime. #3. Second opinion at [facility] on 01/21/2014 with Dr. Leslie Andrea. Patient may be a candidate for MIBG therapy or possibly Xeloda plus Temodar. #4. Followup on 01/24/2014 with lab tests and continuation of lantreotide.   All questions were answered. The patient knows to call the clinic with any problems, questions or concerns. We can certainly see the patient much sooner if necessary.   I spent 40 minutes counseling the patient face to face. The total time spent in the appointment was 55 minutes.    Doroteo Bradford, MD 01/03/2014 8:37 AM  DISCLAIMER:  This note was dictated with voice recognition software.  Similar sounding words can inadvertently be transcribed inaccurately and may not be corrected upon review.  g

## 2014-01-03 NOTE — Patient Instructions (Signed)
[facility] Discharge Instructions  RECOMMENDATIONS MADE BY THE CONSULTANT AND ANY TEST RESULTS WILL BE SENT TO YOUR REFERRING PHYSICIAN.  EXAM FINDINGS BY THE PHYSICIAN TODAY AND SIGNS OR SYMPTOMS TO REPORT TO CLINIC OR PRIMARY PHYSICIAN: you seen dr Barnet Glasgow today   Script given for megace for appetite and reglan for nausea.  Appt follow up July 30th with labs.  Go by radiology and sign for the CDs to take to [facility]  Thank you for choosing [facility] to provide your oncology and hematology care.  To afford each patient quality time with our providers, please arrive at least 15 minutes before your scheduled appointment time.  With your help, our goal is to use those 15 minutes to complete the necessary work-up to ensure our physicians have the information they need to help with your evaluation and healthcare recommendations.    Effective January 1st, 2014, we ask that you re-schedule your appointment with our physicians should you arrive 10 or more minutes late for your appointment.  We strive to give you quality time with our providers, and arriving late affects you and other patients whose appointments are after yours.    Again, thank you for choosing [facility].  Our hope is that these requests will decrease the amount of time that you wait before being seen by our physicians.       _____________________________________________________________  Should you have questions after your visit to [facility], please contact our office at [phone] between the hours of 8:30 a.m. and 5:00 p.m.  Voicemails left after 4:30 p.m. will not be returned until the following business day.  For prescription refill requests, have your pharmacy contact our office with your prescription refill request.

## 2014-01-05 ENCOUNTER — Emergency Department (HOSPITAL_COMMUNITY): Payer: Medicare HMO

## 2014-01-05 ENCOUNTER — Emergency Department (HOSPITAL_COMMUNITY)
Admission: EM | Admit: 2014-01-05 | Discharge: 2014-01-26 | Disposition: E | Payer: Medicare HMO | Attending: Emergency Medicine | Admitting: Emergency Medicine

## 2014-01-05 ENCOUNTER — Encounter (HOSPITAL_COMMUNITY): Payer: Self-pay | Admitting: Emergency Medicine

## 2014-01-05 ENCOUNTER — Other Ambulatory Visit: Payer: Self-pay

## 2014-01-05 DIAGNOSIS — Z79899 Other long term (current) drug therapy: Secondary | ICD-10-CM | POA: Insufficient documentation

## 2014-01-05 DIAGNOSIS — R230 Cyanosis: Secondary | ICD-10-CM | POA: Insufficient documentation

## 2014-01-05 DIAGNOSIS — R609 Edema, unspecified: Secondary | ICD-10-CM | POA: Insufficient documentation

## 2014-01-05 DIAGNOSIS — Z8711 Personal history of peptic ulcer disease: Secondary | ICD-10-CM | POA: Insufficient documentation

## 2014-01-05 DIAGNOSIS — Z8509 Personal history of malignant neoplasm of other digestive organs: Secondary | ICD-10-CM | POA: Insufficient documentation

## 2014-01-05 DIAGNOSIS — J9600 Acute respiratory failure, unspecified whether with hypoxia or hypercapnia: Secondary | ICD-10-CM | POA: Insufficient documentation

## 2014-01-05 DIAGNOSIS — Z880 Allergy status to penicillin: Secondary | ICD-10-CM | POA: Insufficient documentation

## 2014-01-05 DIAGNOSIS — Z8781 Personal history of (healed) traumatic fracture: Secondary | ICD-10-CM | POA: Insufficient documentation

## 2014-01-05 DIAGNOSIS — Z85830 Personal history of malignant neoplasm of bone: Secondary | ICD-10-CM | POA: Insufficient documentation

## 2014-01-05 DIAGNOSIS — C787 Secondary malignant neoplasm of liver and intrahepatic bile duct: Secondary | ICD-10-CM | POA: Insufficient documentation

## 2014-01-05 DIAGNOSIS — C7B00 Secondary carcinoid tumors, unspecified site: Secondary | ICD-10-CM

## 2014-01-05 DIAGNOSIS — I10 Essential (primary) hypertension: Secondary | ICD-10-CM | POA: Insufficient documentation

## 2014-01-05 DIAGNOSIS — R231 Pallor: Secondary | ICD-10-CM | POA: Insufficient documentation

## 2014-01-05 DIAGNOSIS — Z872 Personal history of diseases of the skin and subcutaneous tissue: Secondary | ICD-10-CM | POA: Insufficient documentation

## 2014-01-05 DIAGNOSIS — Z87891 Personal history of nicotine dependence: Secondary | ICD-10-CM | POA: Insufficient documentation

## 2014-01-05 DIAGNOSIS — C772 Secondary and unspecified malignant neoplasm of intra-abdominal lymph nodes: Secondary | ICD-10-CM | POA: Insufficient documentation

## 2014-01-05 MED ORDER — FUROSEMIDE 10 MG/ML IJ SOLN: 60.0000 mg | Freq: Once | INTRAMUSCULAR | Status: DC

## 2014-01-05 MED ORDER — MORPHINE SULFATE 4 MG/ML IJ SOLN
INTRAMUSCULAR | Status: AC
  Filled 2014-01-05: qty 1

## 2014-01-05 MED ORDER — MORPHINE SULFATE 4 MG/ML IJ SOLN
INTRAMUSCULAR | Status: AC
  Administered 2014-01-05: 4 mg via INTRAMUSCULAR
  Filled 2014-01-05: qty 1

## 2014-01-05 MED ORDER — MORPHINE SULFATE 4 MG/ML IJ SOLN
4.0000 mg | Freq: Once | INTRAMUSCULAR | Status: AC
  Administered 2014-01-05: 4 mg via INTRAMUSCULAR

## 2014-01-07 ENCOUNTER — Other Ambulatory Visit (HOSPITAL_COMMUNITY): Payer: Self-pay | Admitting: Oncology

## 2014-01-15 ENCOUNTER — Ambulatory Visit: Payer: Medicare HMO | Admitting: Family Medicine

## 2014-01-16 ENCOUNTER — Telehealth (HOSPITAL_COMMUNITY): Payer: Self-pay | Admitting: Hematology and Oncology

## 2014-01-16 NOTE — Telephone Encounter (Signed)
Returned a call to Dr [facility] and informed them that pt is deceased.

## 2014-01-24 ENCOUNTER — Ambulatory Visit (HOSPITAL_COMMUNITY): Payer: Medicare HMO

## 2014-01-24 ENCOUNTER — Other Ambulatory Visit (HOSPITAL_COMMUNITY): Payer: Medicare HMO

## 2014-01-26 NOTE — ED Notes (Signed)
Patient moved to room 2 due to acuity. I assume care of patient at this time.

## 2014-01-26 NOTE — ED Notes (Signed)
Patient placed on bipap by RRT.

## 2014-01-26 NOTE — ED Notes (Signed)
Family at bedside. 

## 2014-01-26 NOTE — ED Notes (Signed)
PT has worsening in SOB and peripheral edema with decrease po intake. PT has neuroendocrine metastatic tumor and is tx at [facility]. Family noticed bright red blood in stool this am.

## 2014-01-26 NOTE — ED Notes (Signed)
Dr Tomi Bamberger called to bedside. Patient with apnea and agonal respirations. HR decreasing steadily. HR 62 at present.

## 2014-01-26 NOTE — ED Notes (Signed)
Bipap removed by RRT per Dr Tomi Bamberger orders. Patient placed on NRB at 15 Liters/minute.

## 2014-01-26 NOTE — ED Notes (Signed)
Dr Tomi Bamberger at bedside. Time of Death 46

## 2014-01-26 NOTE — ED Notes (Signed)
Post mortum eye care completed, patient cleaned of feces. Awaiting funeral home arrival at present time.

## 2014-01-26 NOTE — ED Provider Notes (Addendum)
CSN: [id number]     Arrival date & time 2014/01/11  1329 History   First MD Initiated Contact with Sarah Casey 01/11/14 1352     This chart was scribed for Janice Norrie, MD by Forrestine Him, ED Scribe. This Sarah Casey was seen in room APA02/APA02 and the Sarah Casey's care was started 2:08 PM.   Chief Complaint  Sarah Casey presents with  . Shortness of Breath   LEVEL 5 CAVEAT DUE TO ACUITY OF CONDITION  HPI  HPI Comments: Sarah Casey is a 72 y.o. female with a PMHx of HTN and cancer who presents to the Emergency Department complaining of constant, severe shortness of breath onset this morning that has progressively worsened. Last known well yesterday. Daughter states pt recently fell 6/13. X-Rays were performed at visit without any acute findings. However, daughter states she has progressively declined since fall. At this time she denies any recent cough. Her daughter states she has had progressive swelling of the left side of her body. She did notice coughing yesterday. She is not a smoker. Pt currently lives alone with relatives alternating in and out periodically. No other concerns this visit.  PCP Dr. Wolfgang Phoenix Oncologist Dr. Barnet Glasgow   Past Medical History  Diagnosis Date  . Fracture of ankle     right  . Hypertension   . Ulcer   . 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 She has a very elevated serum serotonin level to over 1600 and she has an elevated 5-HIAA level in her urine.   . PUD (peptic ulcer disease)   . Bone metastases 09/21/2013   Past Surgical History  Procedure Laterality Date  . Dilation and curettage of uterus    . Cholecystectomy    . Hernia repair      umbilical  . Ankle debridement      right for staff infection  . Colonoscopy  11/09/2003    AST:MHDQ small polyps.  One from cecum was snared.  The others  were biopsied three in the area of hepatic flexure and one at rectum.  None of these polyps appeared to be malignant/Sigmoid colon diverticulosis  . Esophagogastroduodenoscopy   11/01/2003    QIW:LNLGXQJJ bulbar erosion/edema as described above/ 2. Distal esophagus, distal stomach, and pylorus appeared normal/Normal Ampulla of Vater as described above  . Given capsule small bowel  11/09/2003    HER:DEYCX bowel polypoidal lesion with active bleeding, very  suspicious for primary carcinoid.  There was a second submucosal mass probably another metastatic lesion  . Esophagogastroduodenoscopy (egd) with esophageal dilation N/A 08/07/2013    KGY:JEHUDJSH'F ring /  mild erosive reflux esophagitis-status post Maloney dilation and biopsy disruption of ring. Status post biopsy of abnormal gastric mucosa. bx benign   Family History  Problem Relation Age of Onset  . Cancer Sister   . Cancer Sister   . Cancer Sister    History  Substance Use Topics  . Smoking status: Former Smoker -- 1.00 packs/day for 35 years    Types: Cigarettes  . Smokeless tobacco: Never Used  . Alcohol Use: Yes     Comment: occasionally for special occasions   Sarah Casey lives alone however her family visited her recently  OB History   Grav Para Term Preterm Abortions TAB SAB Ect Mult Living  Review of Systems  Unable to perform ROS: Acuity of condition      Allergies  Amoxil; Lodine; and Vasotec  Home Medications   Prior to Admission medications   Medication Sig Start Date End Date Taking? Authorizing Provider  acetaminophen (TYLENOL) 500 MG tablet Take 500 mg by mouth every 6 (six) hours as needed.    Historical Provider, MD  amLODipine (NORVASC) 5 MG tablet TAKE 1 TABLET BY MOUTH EVERY DAY    Mikey Kirschner, MD  cyclobenzaprine (FLEXERIL) 10 MG tablet Take 1 tablet (10 mg total) by mouth 3 (three) times daily as needed for muscle spasms. 11/02/13   Baird Cancer, PA-C  megestrol (MEGACE ES) 625  MG/5ML suspension Take 5 mLs (625 mg total) by mouth daily. 01/03/14   Farrel Gobble, MD  metoCLOPramide (REGLAN) 5 MG tablet Take 2 tablets (10 mg total) by mouth 4 (four) times daily -  before meals and at bedtime. 01/03/14   Farrel Gobble, MD  oxyCODONE (OXY IR/ROXICODONE) 5 MG immediate release tablet Take one to two every 4 hours as needed for pain. Up to 5  per day. 12/17/13   Mikey Kirschner, MD  pantoprazole (PROTONIX) 40 MG tablet Take 1 tablet (40 mg total) by mouth daily. 08/31/13   Mahala Menghini, PA-C   Triage Vitals: BP 151/37  Pulse 133  Resp 26  Ht 5\' 3"  (1.6 m)  Wt 163 lb (73.936 kg)  BMI 28.88 kg/m2  SpO2 82%   Vital signs normal except for tachycardia, tachypnea, hypoxia   Physical Exam  Nursing note and vitals reviewed. Constitutional: She appears well-developed and well-nourished.  HENT:  Head: Normocephalic and atraumatic.  Eyes: Conjunctivae and EOM are normal. Pupils are equal, round, and reactive to light.  Neck: Normal range of motion. Neck supple.  Cardiovascular: Normal rate, regular rhythm and normal heart sounds.   Pulse is in the 70's  Pulmonary/Chest: Accessory muscle usage present. Tachypnea noted. She is in respiratory distress. She has decreased breath sounds.  Diminished breathe sounds  Abdominal: Soft. Bowel sounds are normal.  Musculoskeletal: Normal range of motion. She exhibits edema.  Finger tips and toes are cyanotic  2 plus pitting edema of L arm and lower extremities bilaterally   Neurological:  Sarah Casey is somnolent  Skin: Skin is warm and dry. There is pallor.  Fingertips and toes are purple, Sarah Casey is cool to touch  Psychiatric:  Sarah Casey is lethargic she does arouse briefly    ED Course  Procedures (including critical care time)  Medications  furosemide (LASIX) injection 60 mg (60 mg Intravenous Not Given 02-01-14 1442)  morphine 4 MG/ML injection 4 mg (4 mg Intramuscular Given 02-01-14 1435)  morphine 4 MG/ML injection (4 mg  Intramuscular Given 2014/02/01 1453)  morphine 4 MG/ML injection (  Duplicate 09/15/20 4825)     DIAGNOSTIC STUDIES: Oxygen Saturation is 82% on RA, low by my interpretation.    COORDINATION OF CARE:  Sarah Casey has one daughter and son who are her health care power of attorney's. They showed me that she had signed DO NOT RESUSCITATE forms on July 9 and also a living will. We discussed that her breathing was labored. They were agreeable to trying BiPAP. They do not want her placed on a ventilator.  After multiple attempts to try to get IV access without success family states they do not want an IO access placed. Her son after discussion with the family has decided to not do any intervention that would  prolong her death arriving. They are agreeable to giving her morphine to make her more comfortable. Sarah Casey was given morphine IM. After that she was taken off the BiPAP. Her pulse ox was in the 70s on the BiPAP. She still had cyanotic fingers and feet that were getting progressively worse. She still was having difficulty breathing. She seemed to be agitated having the BiPAP on and seemed to be trying to take it off. She was placed on a non-rebreather mask.  2:36 PM-Pts pulse ox remained in the 70's on and off of BIPAP machine. Fingertips and feet progressively became more cyanotic.   1:61 PM- BP 77 systolic with pulse 09'U. Still having some agonal breathing. She is no longer agitated. She was given a second morphine injection. One of her sons states that she had rectal bleeding yesterday and then again this morning.  3:28 PM- Non-responsive, agonal breathing, faint heart beat  16:07 pt pronounced dead, she has no spontaneous respirations, no heart tones, no pulses. Family at bedside.   19:24 Dr Lance Sell called back and will sign the death certificate.   Review of her chart shows Sarah Casey had carcinoid tumor with widespread metastatic disease.  Labs Review Labs were ordered but were not  obtained.  Imaging Review Dg Chest Portable 1 View  01-12-14   CLINICAL DATA:  Shortness of breath. History of metastatic neuroendocrine tumor.  EXAM: PORTABLE CHEST - 1 VIEW  COMPARISON:  CT chest abdomen pelvis 12/26/2013 and chest radiograph 06/26/2013  FINDINGS: The heart size and mediastinal contours are within normal limits. Lung volumes are low bilaterally. There is bibasilar atelectasis. Small left pleural effusion is visible. A few small pulmonary nodules project over the right lung base. Overall, the Sarah Casey's previously described pulmonary metastatic disease on CT chest, abdomen, pelvis is underestimated on this chest radiograph.  There is a complete fracture through the proximal third of the left clavicle. Pathologic fracture cannot be excluded. This fracture was not present at the time of a chest radiograph performed 06/26/2013, but is visible on the first image of the recent CT chest abdomen pelvis dated 12/26/2013.  IMPRESSION: Low lung volumes with bibasilar atelectasis and small left pleural effusion.  Few small right basilar pulmonary nodules are present, in this Sarah Casey with known pulmonary metastatic disease, best visualized on recent CT chest abdomen pelvis.  Proximal left clavicle fracture.  Pathologic fracture not excluded.   Electronically Signed   By: Curlene Dolphin M.D.   On: 01/12/2014 15:02     EKG Interpretation None        Date: 01-12-2014  Rate: 131  Rhythm: sinus tachycardia  QRS Axis: right  Intervals: normal  ST/T Wave abnormalities: nonspecific T wave changes  Conduction Disutrbances:none  Narrative Interpretation:   Old EKG Reviewed: none available   MDM   Final diagnoses:  Metastatic carcinoid tumor  Acute respiratory failure, unspecified whether with hypoxia or hypercapnia    I personally performed the services described in this documentation, which was scribed in my presence. The recorded information has been reviewed and considered.  Rolland Porter, MD, FACEP     Janice Norrie, MD 01-12-14 [place] Anneliese Leblond, MD 2014/01/12 [phone]

## 2014-01-26 NOTE — ED Notes (Signed)
Patient left ED at this time with [facility] staff.

## 2014-01-26 NOTE — ED Notes (Signed)
No breath sounds noted. MD aware. Will be in to reassess.

## 2014-01-26 DEATH — deceased

## 2014-02-14 ENCOUNTER — Other Ambulatory Visit (HOSPITAL_COMMUNITY): Payer: Self-pay | Admitting: Oncology

## 2015-07-03 IMAGING — CT CT ABD-PELV W/ CM
2 of 5 series · 15 of 46 positions shown, 17 images · IV contrast (Omnipaque 300)
Comparison: 02/05/2013

CLINICAL DATA: Follow-up metastatic carcinoid

EXAM:
CT ABDOMEN AND PELVIS WITH CONTRAST
TECHNIQUE: Multidetector CT imaging of the abdomen and pelvis was performed
using the standard protocol following bolus administration of
intravenous contrast.
CONTRAST:  100mL OMNIPAQUE IOHEXOL 300 MG/ML  SOLN

[Series 2: abd_pel_with 5.0 b40f · axial · 0.67mm/px · z∈[-400,+0]mm · 12 of 90 slices shown, 14 images]
[im 5/90  soft-tissue]
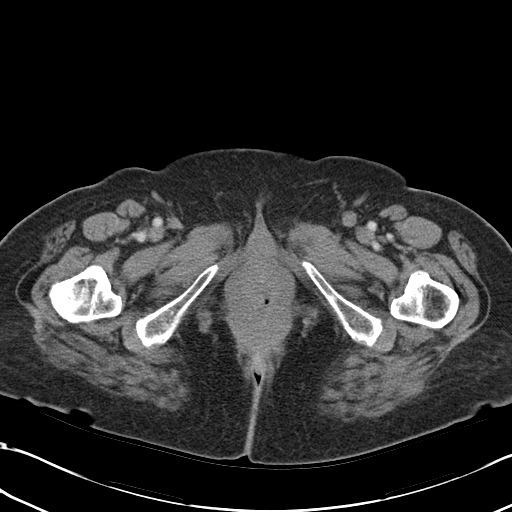
[im 5/90  bone]
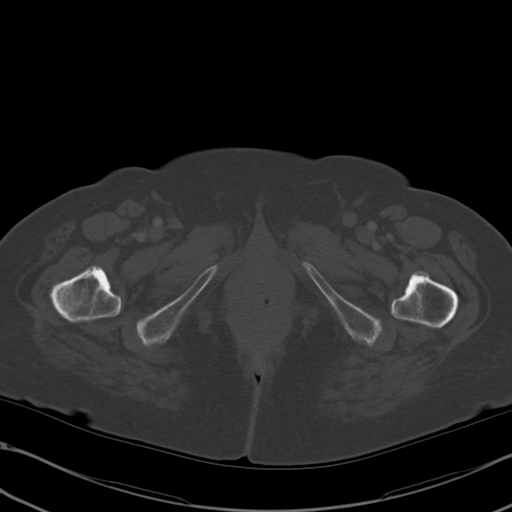
[im 15/90  soft-tissue]
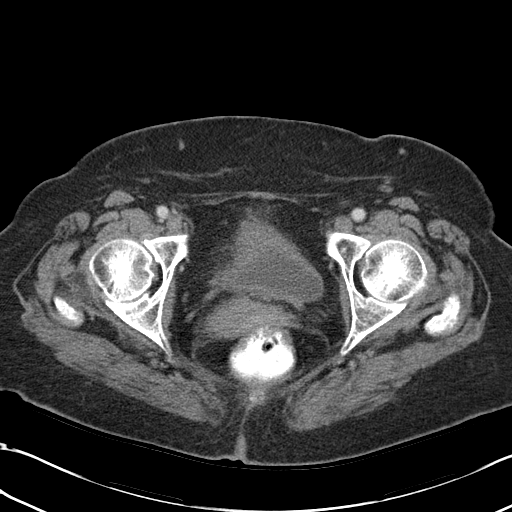
[im 19/90  soft-tissue]
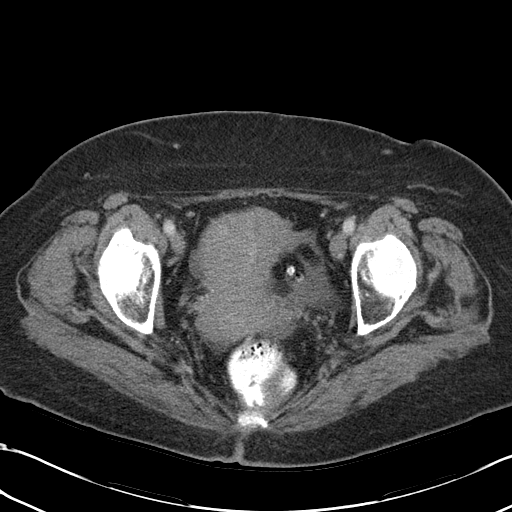
[im 29/90  soft-tissue]
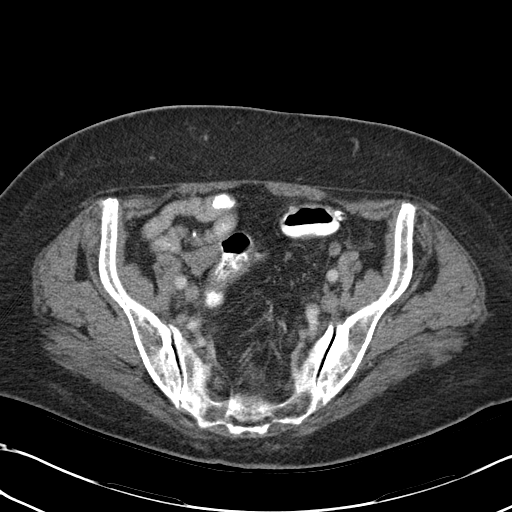
[im 33/90  soft-tissue]
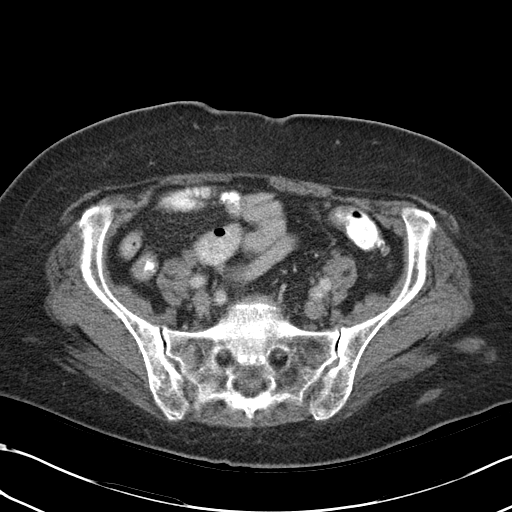
[im 43/90  soft-tissue]
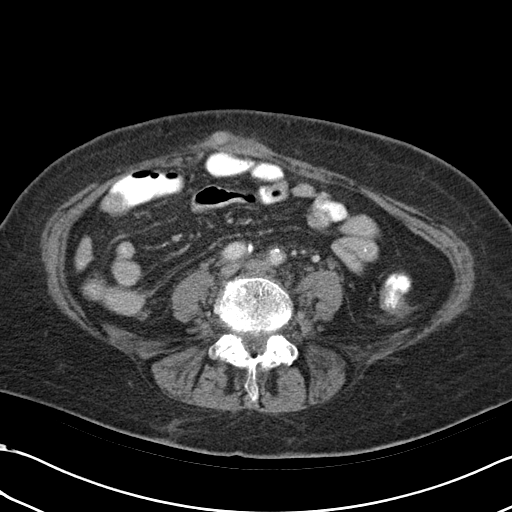
[im 47/90  soft-tissue]
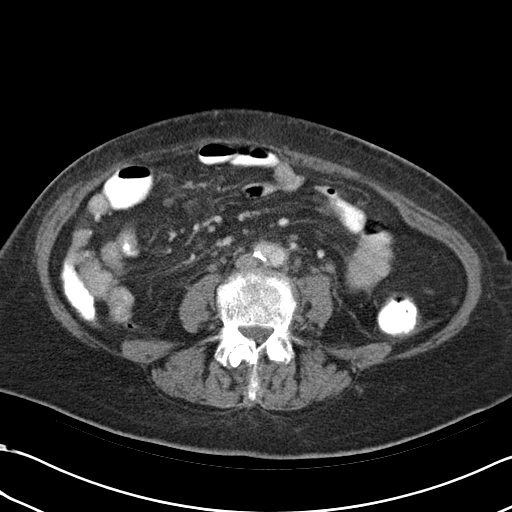
[im 57/90  soft-tissue]
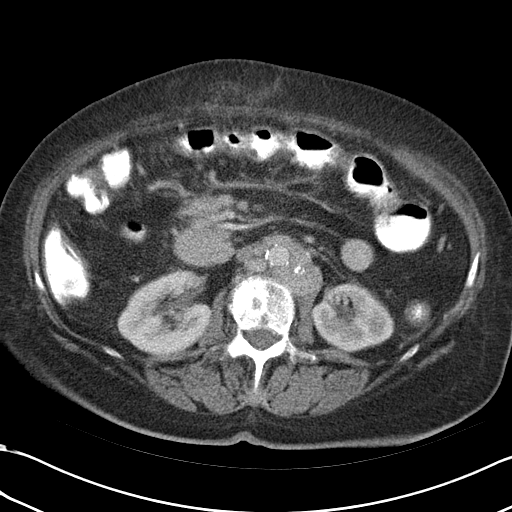
[im 61/90  soft-tissue]
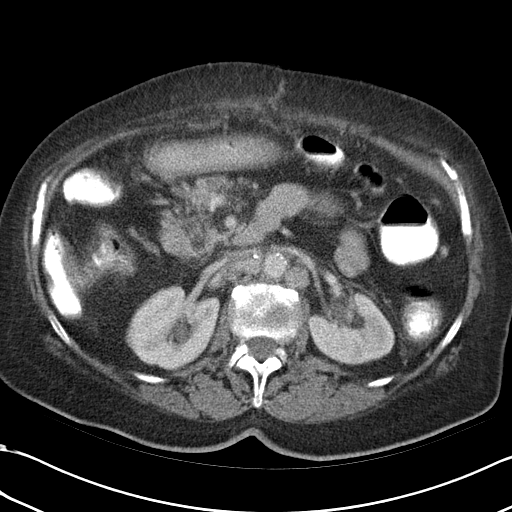
[im 61/90  bone]
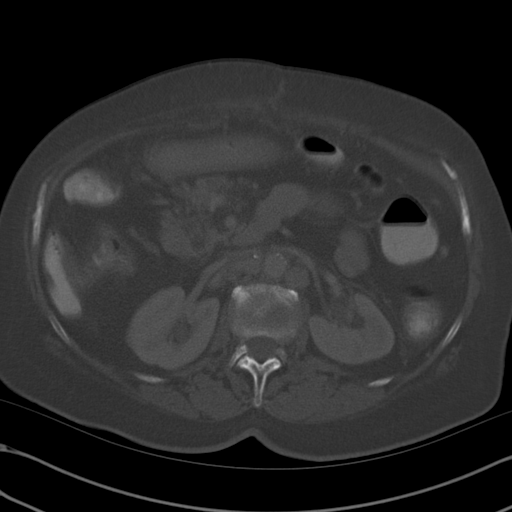
[im 71/90  soft-tissue]
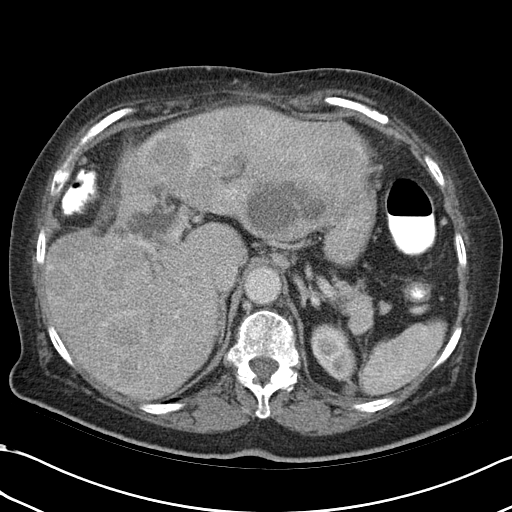
[im 75/90  soft-tissue]
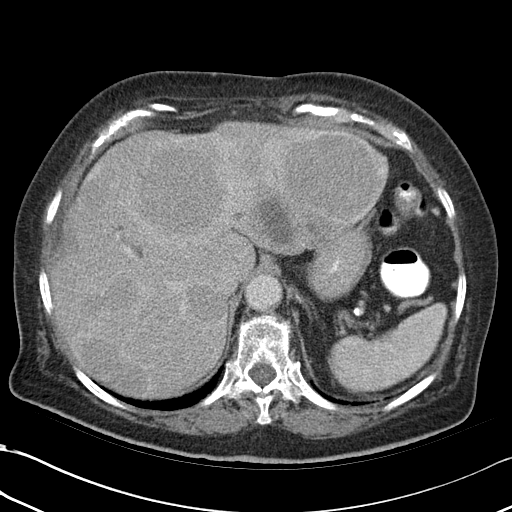
[im 85/90  soft-tissue]
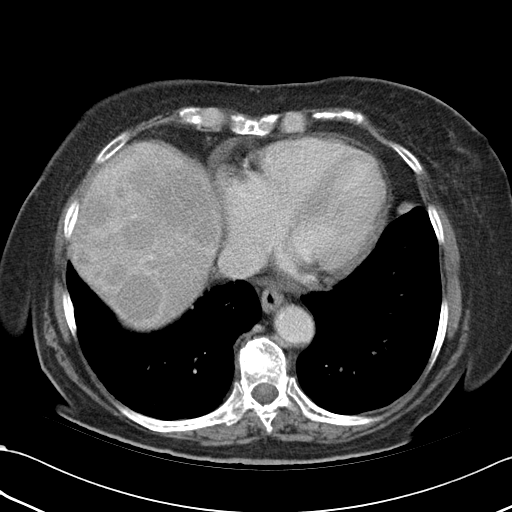

[Series 4: abd_pel_with 3.0 spo cor · coronal · 0.67mm/px · 3 of 73 slices shown]
[im 25/73  soft-tissue]
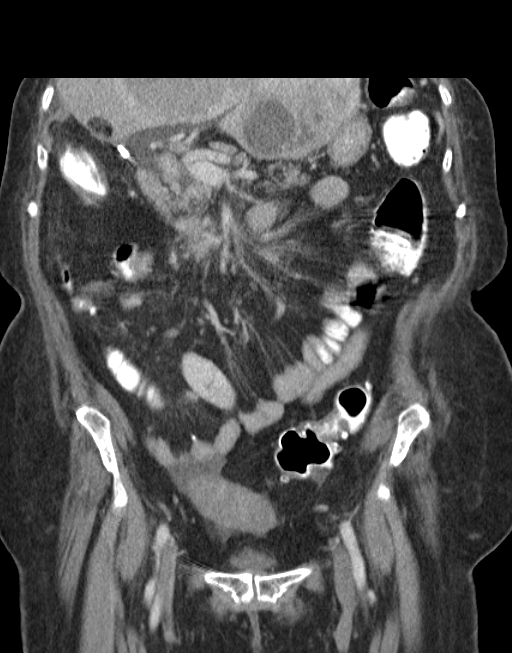
[im 33/73  soft-tissue]
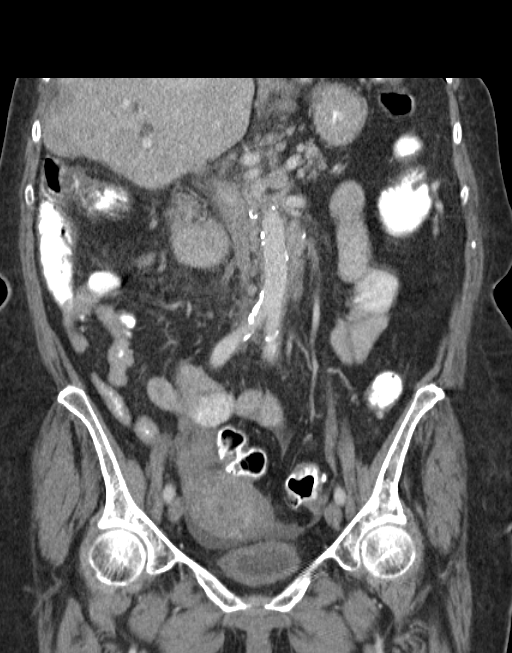
[im 41/73  soft-tissue]
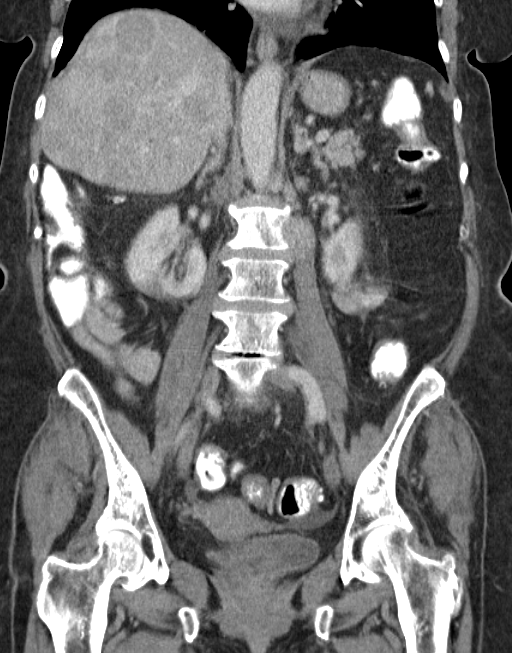

[15 of 46 positions shown; findings below may reference images not displayed]

FINDINGS: Mild progression of bilateral pulmonary nodules at the lung bases,
including:

--8 mm right lower lobe nodule (series 3/image 1), previously 6 mm

--7 mm left lower lobe nodule (series 3/image 4), unchanged

--10 mm nodule to medial right lung base (series 3/image 8),
unchanged

--5 mm nodule of the medial left lung base (series 3/image 80)
previously 3 mm

Innumerable hepatic metastases, progressed.  Index lesions include:

--4.4 x 5.8 cm necrotic lesion posteriorly in the lateral segment
left hepatic lobe (series 2/ image 20), previously 5.2 x 7.1 cm

--2.0 x 2.0 cm lesion inferiorly in the posterior segment right
hepatic lobe (series 2/ image 25), new

--4.1 x 3.4 cm in the anterior hepatic dome (series 2/ image 1),
previously 2.3 x 2.5 cm

--3.2 x 3.4 cm lesion in the anterior segment right hepatic lobe
(series 2/ image 9), previously 2.4 x 2.2 cm

--7.8 x 7.4 cm lesion in the medial segment left hepatic lobe
(series 2/ image 11), previously 6.2 x 6.1 cm

--5.6 x 7.0 cm lesion in the lateral segment left hepatic lobe
(series 2/ image 15), previously 5.2 x 4.6 cm

Spleen and adrenal glands are within normal limits.

Fatty atrophy of the pancreatic body. Pancreas is mildly prominent
at the junction of the pancreatic body and tail (series 2/ image
24), attention on follow-up is suggested.

Status post cholecystectomy. No intrahepatic or extrahepatic ductal
dilatation.

Kidneys are within normal limits.  No hydronephrosis.

No evidence of bowel obstruction. Normal appendix. Colonic
diverticulosis, without associated inflammatory changes.

Atherosclerotic calcifications of the abdominal aorta and branch
vessels.

Trace abdominopelvic ascites.

Retroperitoneal lymphadenopathy, including:

--12 mm short axis anterior para-aortic node (series 2/ image 26),
previously 11 mm

--13 mm short axis retrocaval node (series 2/image 28), unchanged

--12 mm short axis left para-aortic node (series 2/ image 27),
previously 11 mm

--25 mm short axis left para-aortic node (series 2/ image 34),
unchanged

--17 mm short axis left para-aortic node (series 2/ image 40),
previously 14 mm

Uterus and bilateral ovaries are unremarkable.

Bladder is mildly thick-walled although underdistended.

Multifocal osseous metastases in the thoracolumbar spine, including
the along the superior endplate of T9 (sagittal image 54) and
inferior endplates of L2 and L5 (sagittal image 55), new/increased.
No evidence of pathologic fracture.
IMPRESSION: Mild progression of bilateral pulmonary nodules at the lung bases,
measuring up to 10 mm, as above.

Progression of innumerable hepatic metastases, measuring up to
cm, as above.

Mild progression of retroperitoneal lymphadenopathy, measuring up to
2.5 cm short axis.

Multifocal osseous metastases in the thoracolumbar spine,
new/increased.
